# Patient Record
Sex: Female | Born: 1950 | Race: White | Hispanic: No | Marital: Married
Health system: Midwestern US, Community
[De-identification: ages and names within clinical notes are randomized; demographics above are authoritative.]

## PROBLEM LIST (undated history)

## (undated) DIAGNOSIS — I4891 Unspecified atrial fibrillation: Secondary | ICD-10-CM

## (undated) DIAGNOSIS — R011 Cardiac murmur, unspecified: Secondary | ICD-10-CM

## (undated) HISTORY — DX: Unspecified atrial fibrillation: I48.91

## (undated) HISTORY — PX: ABDOMINAL HYSTERECTOMY: SHX81

---

## 2013-03-19 ENCOUNTER — Encounter (HOSPITAL_BASED_OUTPATIENT_CLINIC_OR_DEPARTMENT_OTHER): Payer: Self-pay | Admitting: *Deleted

## 2013-03-19 ENCOUNTER — Emergency Department (HOSPITAL_BASED_OUTPATIENT_CLINIC_OR_DEPARTMENT_OTHER)
Admission: EM | Admit: 2013-03-19 | Discharge: 2013-03-19 | Disposition: A | Discharge status: Home / Self Care | Payer: BC Managed Care – PPO | Attending: Emergency Medicine | Admitting: Emergency Medicine

## 2013-03-19 DIAGNOSIS — Y93G1 Activity, food preparation and clean up: Secondary | ICD-10-CM | POA: Insufficient documentation

## 2013-03-19 DIAGNOSIS — Y92009 Unspecified place in unspecified non-institutional (private) residence as the place of occurrence of the external cause: Secondary | ICD-10-CM | POA: Insufficient documentation

## 2013-03-19 DIAGNOSIS — W268XXA Contact with other sharp object(s), not elsewhere classified, initial encounter: Secondary | ICD-10-CM | POA: Insufficient documentation

## 2013-03-19 DIAGNOSIS — S61209A Unspecified open wound of unspecified finger without damage to nail, initial encounter: Secondary | ICD-10-CM

## 2013-03-19 NOTE — ED Provider Notes (Signed)
History    This chart was scribed for Geoffery Lyons, MD scribed by Magnus Sinning. The patient was seen in room MH12/MH12 at 17:44   CSN: [id number]  Arrival date & time 03/19/13  1629     Chief Complaint  Patient presents with  . Laceration    (Consider location/radiation/quality/duration/timing/severity/associated sxs/prior treatment) Patient is a 62 y.o. female presenting with skin laceration. The history is provided by the patient and the spouse. No language interpreter was used.  Laceration  Monica Lowe is a 62 y.o. female who presents to the Emergency Department complaining of a 1.5 cm laceration to her left finger as a result of an accident that occurred this afternoon in her home. She states that she was unloading dishes from dishwasher and she notes that her left finger was cut on some glass that shattered in the dishwasher. She explains she went to an Urgent Care, but states they were unable to repair because of tendon involvement.  She says that she was given a tetanus at Urgent Care.   History reviewed. No pertinent past medical history.  Past Surgical History  Procedure Laterality Date  . Abdominal hysterectomy      History reviewed. No pertinent family history.  History  Substance Use Topics  . Smoking status: Never Smoker   . Smokeless tobacco: Not on file  . Alcohol Use: No    Review of Systems  Skin:       Left fifth finger laceration   All other systems reviewed and are negative.    Allergies  Sulfa antibiotics  Home Medications  No current outpatient prescriptions on file.  BP 122/60  Pulse 73  Temp(Src) 98.5 F (36.9 C) (Oral)  Resp 16  Ht 5\' 9"  (1.753 m)  Wt 132 lb (59.875 kg)  BMI 19.48 kg/m2  SpO2 100%  Physical Exam  Nursing note and vitals reviewed. Constitutional: She is oriented to person, place, and time. She appears well-developed and well-nourished.  HENT:  Head: Normocephalic and atraumatic.  Eyes: Conjunctivae and  EOM are normal.  Neck: Neck supple.  Cardiovascular: Normal rate.   Pulmonary/Chest: Effort normal.  Musculoskeletal: Normal range of motion. She exhibits no edema.  The left fifth finger is noted to have a laceration on the dorsal aspect between the PIP and DIP joint. The DIP joint is noted to be in flexion and she is unable to extend the fingertip.   Neurological: She is oriented to person, place, and time.  Skin: Skin is warm. No rash noted. No erythema.  Psychiatric: She has a normal mood and affect. Her behavior is normal.    ED Course  Procedures (including critical care time) DIAGNOSTIC STUDIES: Oxygen Saturation is 100% on room air, normal by my interpretation.    COORDINATION OF CARE: 17:45: Physical exam performed. 17:54: Consult performed and patient case discussed with, Orthopedist,Dr. Melvyn Novas. Dr. Melvyn Novas recommends staples to be placed and for the finger to be splinted. Additionally, states patient should follow-up with him on Tuesday, 03/22/13 for evaluation.   Labs Reviewed - No data to display No results found.   No diagnosis found.  LACERATION REPAIR Performed by: Geoffery Lyons Authorized by: Geoffery Lyons Consent: Verbal consent obtained. Risks and benefits: risks, benefits and alternatives were discussed Consent given by: patient Patient identity confirmed: provided demographic data Prepped and Draped in normal sterile fashion Wound explored  Laceration Location: left 5th finger  Laceration Length: 1.5 cm  No Foreign Bodies seen or palpated  Anesthesia: local infiltration  Local anesthetic: lidocaine 1% without epinephrine  Anesthetic total: 1 ml  Irrigation method: syringe Amount of cleaning: standard  Skin closure: 5-0 ethilon  Number of sutures: 2  Technique: simple interrupted  Patient tolerance: Patient tolerated the procedure well with no immediate complications.   MDM  Patient sent here from [facility] clinic for evaluation of a  tendon laceration to the left 5th finger she sustained on a broken glass.  The DIP joint was clearly in flexion and she was unable to extend it.  I spoke with Dr. Melvyn Novas who was on call for hand surgery.  His recommendations were for follow up in the office on Tuesday when he would repair the injury in the office.  He recommended loose closure of the wound which I did perform.  She received a dtap at the outside clinic.  She will be discharged to home, with follow up with Dr. Melvyn Novas.    I personally performed the services described in this documentation, which was scribed in my presence. The recorded information has been reviewed and is accurate.           Geoffery Lyons, MD 03/20/13 [phone]

## 2013-03-19 NOTE — ED Notes (Signed)
Pt states she cut her left little finger on a piece of glass. Sent here from [facility] d/t possible tendon involvement. Bandage PTA and PMS intact.

## 2014-09-05 ENCOUNTER — Other Ambulatory Visit (HOSPITAL_COMMUNITY): Payer: BC Managed Care – PPO

## 2014-09-11 ENCOUNTER — Ambulatory Visit (HOSPITAL_COMMUNITY): Payer: BC Managed Care – PPO | Attending: Internal Medicine | Admitting: Cardiology

## 2014-09-11 ENCOUNTER — Other Ambulatory Visit (HOSPITAL_COMMUNITY): Payer: BC Managed Care – PPO | Admitting: Family Medicine

## 2014-09-11 DIAGNOSIS — I379 Nonrheumatic pulmonary valve disorder, unspecified: Secondary | ICD-10-CM | POA: Diagnosis not present

## 2014-09-11 DIAGNOSIS — R011 Cardiac murmur, unspecified: Secondary | ICD-10-CM

## 2014-09-11 DIAGNOSIS — I079 Rheumatic tricuspid valve disease, unspecified: Secondary | ICD-10-CM | POA: Diagnosis not present

## 2014-09-11 NOTE — Progress Notes (Signed)
Echo performed. 

## 2016-04-19 ENCOUNTER — Inpatient Hospital Stay: Admit: 2016-04-19 | Discharge: 2016-04-20 | Disposition: A | Discharge status: Home / Self Care

## 2016-04-19 NOTE — ED Provider Notes (Signed)
PATIENT:          Lindsey JunglingRITTENHOUSE, Lindsey      DOS:           04/19/2016  MR #:             [id number]             ACCOUNT #:     [id number]  DATE OF BIRTH:    [birth year]              AGE:           65      HISTORY OF PRESENT ILLNESS:    PERTINENT HISTORY OF PRESENT  ILLNESS. 65 year old female,  presenting with  shortness of breath and generalized weakness.  Patient notes that  since  December she had what sounded like a upper respiratory infection that  then  progressed to bronchitis and has never really recovered since then.  However  [place]day [place] to [place] her family here on his 7  hour car  ride.  Was feeling okay yesterday but then towards the evening last  night  started to feel short of breath and started having some chest  tightness.  She  states that this worsened anytime she tried to exert herself and  basically  today has not really been able to get up out of bed because she just  feels so  weak.  She does report 7 episodes of diarrhea today, nonbloody.  Denies any  abdominal pain.  Denies any nausea or vomiting.  Has also had some  decreased  appetite but denies any fevers.  No cough.  No leg swelling or leg  pain.  No  history of deep vein thromboses.    PERTINENT PAST/ FAMILY/SOCIAL HISTORY Past medical history: none  Past surgical history: Hysterectomy  Family history: reviewed and noncontributory  Social history: denies tobacco, etoh, and illicit drug use    Review of systems: as per hpi otherwise negative in detail        PHYSICAL EXAM Vital Signs: Reviewed  Constitutional: comfortable, no acute distress  Head: normocephalic, atraumatic  Eyes: no scleral injection, no scleral icterus, no conjunctival  erythema  ENT: oropharynx clear, dry MM  Neck: supple, trachea midline  Cardiovascular: Tachycardic, RR, no m/r/g  Pulmonary: no evidence of labored breathing, clear to auscultation  bilaterally  Abdominal: soft, nontender, nondistended  Extremities: warm, well perfused, no edema,  no calf tenderness  Neurological: AAO x 3, nonfocal  Skin: warm, dry, no rash  Psych: no suicidal ideation    MEDICAL DECISION MAKING:    SIGNIFICANT FINDINGS/ED COURSE/MEDICAL DECISION MAKING/TREATMENT  PLAN EKG  (04/19/16, 19:37): Rate: 98, Rhythm: sinus, Axis: normal, Intervals: PR  165, QRS  74, QTc 421, Interpretation: no acute ischemic changes, Old: no old to  compare    Patient's blood work was unremarkable including a negative troponin.  lectrocardiogram also without any acute changes.  CT of the chest  showed no  signs of pulmonary embolism.  Patient this point is no signs of any  clot.  Low  concern for ACS and patient with a heart score of 2.  I did offer the  patient a  repeat troponin 3 hours did discuss decreasing her risk from 2% 1%.  Patient  does not want to wait for that.  I do believe that she likely has  dehydration  secondary to diarrhea but with a benign abdominal exam have low  concern for any  acute processes requiring antibiotics.  A counseled her on continued  hydration.   She is from [place] was given copies of her lab work as well  as her  imaging and counseled to get a primary care doctor when she gets back  home.  She was given strict return instructions and stated understanding.    Please note this report has been produced using speech recognition  software and  may contain errors related to that system including errors in  grammar,  punctuation, and spelling, as well as words and phrases that may be  inappropriate. If there are questions or concerns please feel free to  contact  the dictating provider for clarification.    PROBLEM LIST:       Admit Reason:     Short of Breath, Weakness: Entered Date: 19-Apr-2016 19:43, Entered  By:  Standley Brooking, Status: Active        DIAGNOSIS 1. Dehydration, acute, initial visit  2. dyspnea, acute, initial visit  3. diarrhea, acute, initial visit    Electronic Signatures:  Ronaldo Miyamoto (MD)  (Signed 19-Apr-2016 23:50)   Authored:  HISTORY OF PRESENT ILLNESS, PHYSICAL EXAM, MEDICAL DECISION  MAKING,  PROBLEM LIST, DIAGNOSIS      Last Updated: 19-Apr-2016 23:50 by Ronaldo Miyamoto (MD)            Please see T-Sheet, initial assessment, and physician orders for  further details.    Dictating Physician: Ronaldo Miyamoto, MD  Original Electronic Signature Date: 04/19/2016 08:04 P  BL  Document #: [id number]    cc:  PCP No       So[place]

## 2016-04-20 LAB — CBC WITH AUTO DIFFERENTIAL
Absolute Baso #: 0.1 10*3/uL (ref 0.0–0.2)
Absolute Eos #: 0.3 10*3/uL (ref 0.0–0.5)
Absolute Lymph #: 1.2 10*3/uL (ref 1.0–4.3)
Absolute Mono #: 0.4 10*3/uL (ref 0.0–0.8)
Absolute Neut #: 8.3 10*3/uL — ABNORMAL HIGH (ref 1.8–7.0)
Basophils: 0.5 %
Eosinophils: 3.1 %
Granulocytes %: 80.7 %
Hematocrit: 40.8 % (ref 35.0–47.0)
Hemoglobin: 13.1 g/dL (ref 11.7–16.0)
Lymphocyte %: 11.5 %
MCH: 27.5 pg (ref 26.0–34.0)
MCHC: 32.2 % (ref 32.0–36.0)
MCV: 85.3 fL (ref 79.0–98.0)
MPV: 9.2 fL (ref 7.4–10.4)
Monocytes: 4.2 %
Platelets: 253 10*3/uL (ref 140–440)
RBC: 4.78 10*6/uL (ref 3.80–5.20)
RDW: 13.6 % (ref 11.5–14.5)
WBC: 10.2 10*3/uL (ref 3.6–10.7)

## 2016-04-20 LAB — COMPREHENSIVE METABOLIC PANEL
ALT: 20 U/L (ref 12–78)
AST: 29 U/L (ref 15–37)
Albumin,Serum: 4 g/dL (ref 3.4–5.0)
Alkaline Phosphatase: 61 U/L (ref 45–117)
Anion Gap: 9 NA
BUN: 10 mg/dL (ref 7–25)
CO2: 26 mmol/L (ref 21–32)
Calcium: 9 mg/dL (ref 8.2–10.1)
Chloride: 105 mmol/L (ref 98–109)
Creatinine: 0.67 mg/dL (ref 0.55–1.40)
EGFR IF NonAfrican American: >60 mL/min (ref >60)
Glucose: 88 mg/dL (ref 70–100)
Potassium: 3.8 mmol/L (ref 3.5–5.1)
Sodium: 140 mmol/L (ref 135–145)
Total Bilirubin: 0.4 mg/dL (ref 0.2–1.0)
Total Protein: 8 g/dL (ref 6.4–8.2)
eGFR African American: >60 mL/min (ref >60)

## 2016-04-20 LAB — MAGNESIUM: Magnesium: 2.5 mg/dL — ABNORMAL HIGH (ref 1.8–2.4)

## 2016-04-20 LAB — TROPONIN: Troponin I: 0.037 ng/mL (ref 0.000–0.045)

## 2016-04-20 LAB — BRAIN NATRIURETIC PEPTIDE: NT Pro-BNP: 98 pg/mL (ref 0–125)

## 2016-04-20 NOTE — Other (Unsigned)
Patient Acct Nbr: [id number]   Primary AUTH/CERT:   Primary Insurance Company Name: [employer] [facility]  Primary Insurance Plan name: [employer]  Primary Insurance Group Number: [id number]  Primary Insurance Plan Type: Health  Primary Insurance Policy Number: [id number]

## 2017-03-19 DIAGNOSIS — Z1272 Encounter for screening for malignant neoplasm of vagina: Secondary | ICD-10-CM | POA: Diagnosis not present

## 2018-02-05 DIAGNOSIS — R0602 Shortness of breath: Secondary | ICD-10-CM | POA: Diagnosis not present

## 2018-02-05 DIAGNOSIS — J9809 Other diseases of bronchus, not elsewhere classified: Secondary | ICD-10-CM | POA: Diagnosis not present

## 2018-02-05 DIAGNOSIS — J069 Acute upper respiratory infection, unspecified: Secondary | ICD-10-CM | POA: Diagnosis not present

## 2018-02-09 DIAGNOSIS — Z792 Long term (current) use of antibiotics: Secondary | ICD-10-CM | POA: Diagnosis not present

## 2018-02-09 DIAGNOSIS — J45909 Unspecified asthma, uncomplicated: Secondary | ICD-10-CM | POA: Diagnosis not present

## 2019-06-02 DIAGNOSIS — Z1211 Encounter for screening for malignant neoplasm of colon: Secondary | ICD-10-CM | POA: Diagnosis not present

## 2020-08-27 DIAGNOSIS — Z20828 Contact with and (suspected) exposure to other viral communicable diseases: Secondary | ICD-10-CM | POA: Diagnosis not present

## 2020-09-13 DIAGNOSIS — I83811 Varicose veins of right lower extremities with pain: Secondary | ICD-10-CM | POA: Diagnosis not present

## 2020-09-13 DIAGNOSIS — I8311 Varicose veins of right lower extremity with inflammation: Secondary | ICD-10-CM | POA: Diagnosis not present

## 2020-09-13 DIAGNOSIS — I83891 Varicose veins of right lower extremities with other complications: Secondary | ICD-10-CM | POA: Diagnosis not present

## 2020-09-19 DIAGNOSIS — I8311 Varicose veins of right lower extremity with inflammation: Secondary | ICD-10-CM | POA: Diagnosis not present

## 2020-10-02 DIAGNOSIS — I83811 Varicose veins of right lower extremities with pain: Secondary | ICD-10-CM | POA: Diagnosis not present

## 2020-10-02 DIAGNOSIS — I8311 Varicose veins of right lower extremity with inflammation: Secondary | ICD-10-CM | POA: Diagnosis not present

## 2020-10-19 DIAGNOSIS — Z23 Encounter for immunization: Secondary | ICD-10-CM | POA: Diagnosis not present

## 2020-10-29 DIAGNOSIS — L820 Inflamed seborrheic keratosis: Secondary | ICD-10-CM | POA: Diagnosis not present

## 2020-10-31 DIAGNOSIS — I8311 Varicose veins of right lower extremity with inflammation: Secondary | ICD-10-CM | POA: Diagnosis not present

## 2020-11-08 DIAGNOSIS — I8311 Varicose veins of right lower extremity with inflammation: Secondary | ICD-10-CM | POA: Diagnosis not present

## 2020-11-16 DIAGNOSIS — I8311 Varicose veins of right lower extremity with inflammation: Secondary | ICD-10-CM | POA: Diagnosis not present

## 2020-12-12 DIAGNOSIS — I8311 Varicose veins of right lower extremity with inflammation: Secondary | ICD-10-CM | POA: Diagnosis not present

## 2020-12-12 DIAGNOSIS — I83811 Varicose veins of right lower extremities with pain: Secondary | ICD-10-CM | POA: Diagnosis not present

## 2020-12-12 DIAGNOSIS — M7981 Nontraumatic hematoma of soft tissue: Secondary | ICD-10-CM | POA: Diagnosis not present

## 2021-02-11 ENCOUNTER — Other Ambulatory Visit: Payer: Self-pay

## 2021-02-11 ENCOUNTER — Ambulatory Visit (INDEPENDENT_AMBULATORY_CARE_PROVIDER_SITE_OTHER): Payer: Medicare Other | Admitting: Dermatology

## 2021-02-11 ENCOUNTER — Encounter: Payer: Self-pay | Admitting: Dermatology

## 2021-02-11 DIAGNOSIS — D1800 Hemangioma unspecified site: Secondary | ICD-10-CM

## 2021-02-11 DIAGNOSIS — Z1283 Encounter for screening for malignant neoplasm of skin: Secondary | ICD-10-CM

## 2021-02-11 DIAGNOSIS — L738 Other specified follicular disorders: Secondary | ICD-10-CM | POA: Diagnosis not present

## 2021-02-11 DIAGNOSIS — L820 Inflamed seborrheic keratosis: Secondary | ICD-10-CM | POA: Diagnosis not present

## 2021-02-11 NOTE — Patient Instructions (Signed)
  Dr. Bud Face, MD, [facility]  Plastic surgeon in [place], [place] Address: [address] [place], [place] [address] Hours:  Open ? Closes [place]: Mask required  Temperature check required  Staff wear masks  More details Phone: [phone]

## 2021-02-20 DIAGNOSIS — I8311 Varicose veins of right lower extremity with inflammation: Secondary | ICD-10-CM | POA: Diagnosis not present

## 2021-02-22 ENCOUNTER — Encounter: Payer: Self-pay | Admitting: Dermatology

## 2021-02-22 NOTE — Progress Notes (Signed)
   New Patient   Subjective  Monica Lowe is a 70 y.o. female who presents for the following: Annual Exam (Upper body check).  General skin check Location: Several places she would like looked at. Duration:  Quality:  Associated Signs/Symptoms: Modifying Factors:  Severity:  Timing: Context:    The following portions of the chart were reviewed this encounter and updated as appropriate:  Tobacco  Allergies  Meds  Problems  Med Hx  Surg Hx  Fam Hx      Objective  Well appearing patient in no apparent distress; mood and affect are within normal limits. Objective  Left Breast: Had a previous dermatologist; no biopsies she said to show skin cancer. No real concerns for today.  Waist up skin examination today showed no atypical moles, melanoma, or nonmelanoma skin cancer.  Objective  Right Breast: Waist up skin examination-no atypical moles or non mole skin cancers  Objective  Left Breast, Left Upper Back (14), Neck - Anterior: Inflamed tan-pink flat keratotic papules  Objective  Mid Back: Multiple red raised 1 to 3 mm smooth papules  Objective  Right Buccal Cheek: 2 mm cream-colored papule with a central dell.     All skin waist up examined.   Assessment & Plan  Screening exam for skin cancer Left Breast  Yearly skin examination, encouraged to self examine twice annually.  Encounter for screening for malignant neoplasm of skin Right Breast  Yearly skin check  Seborrheic keratosis, inflamed (16) Neck - Anterior; Left Breast; Left Upper Back (14)  Destruction of lesion - Left Breast, Left Upper Back, Neck - Anterior Complexity: simple   Destruction method: cryotherapy   Informed consent: discussed and consent obtained   Timeout:  patient name, date of birth, surgical site, and procedure verified Lesion destroyed using liquid nitrogen: Yes   Cryotherapy cycles:  3 Outcome: patient tolerated procedure well with no complications    Hemangioma,  unspecified site Mid Back  Okay to leave if stable  Sebaceous hyperplasia of face Right Buccal Cheek  Patient told that this may resemble early basal cell skin cancer so if there is growth or bleeding she'll return for biopsy.

## 2021-03-01 DIAGNOSIS — I8311 Varicose veins of right lower extremity with inflammation: Secondary | ICD-10-CM | POA: Diagnosis not present

## 2021-03-01 NOTE — Progress Notes (Signed)
I, Lavonna Monarch, MD, have reviewed all documentation for this visit. The documentation on 03/01/21 for the exam, diagnosis, procedures, and orders are all accurate and complete.

## 2021-03-06 DIAGNOSIS — I8311 Varicose veins of right lower extremity with inflammation: Secondary | ICD-10-CM | POA: Diagnosis not present

## 2021-03-15 DIAGNOSIS — I8311 Varicose veins of right lower extremity with inflammation: Secondary | ICD-10-CM | POA: Diagnosis not present

## 2021-04-05 DIAGNOSIS — I8311 Varicose veins of right lower extremity with inflammation: Secondary | ICD-10-CM | POA: Diagnosis not present

## 2021-06-04 DIAGNOSIS — H524 Presbyopia: Secondary | ICD-10-CM | POA: Diagnosis not present

## 2021-06-04 DIAGNOSIS — H52223 Regular astigmatism, bilateral: Secondary | ICD-10-CM | POA: Diagnosis not present

## 2021-06-04 DIAGNOSIS — H43813 Vitreous degeneration, bilateral: Secondary | ICD-10-CM | POA: Diagnosis not present

## 2021-06-04 DIAGNOSIS — D3132 Benign neoplasm of left choroid: Secondary | ICD-10-CM | POA: Diagnosis not present

## 2021-06-04 DIAGNOSIS — H40023 Open angle with borderline findings, high risk, bilateral: Secondary | ICD-10-CM | POA: Diagnosis not present

## 2021-06-04 DIAGNOSIS — H5203 Hypermetropia, bilateral: Secondary | ICD-10-CM | POA: Diagnosis not present

## 2021-07-10 DIAGNOSIS — H40022 Open angle with borderline findings, high risk, left eye: Secondary | ICD-10-CM | POA: Diagnosis not present

## 2021-07-10 DIAGNOSIS — H40011 Open angle with borderline findings, low risk, right eye: Secondary | ICD-10-CM | POA: Diagnosis not present

## 2021-10-21 DIAGNOSIS — Z23 Encounter for immunization: Secondary | ICD-10-CM | POA: Diagnosis not present

## 2022-01-24 DIAGNOSIS — Z780 Asymptomatic menopausal state: Secondary | ICD-10-CM | POA: Diagnosis not present

## 2022-01-24 DIAGNOSIS — Z1231 Encounter for screening mammogram for malignant neoplasm of breast: Secondary | ICD-10-CM | POA: Diagnosis not present

## 2022-01-24 DIAGNOSIS — R030 Elevated blood-pressure reading, without diagnosis of hypertension: Secondary | ICD-10-CM | POA: Diagnosis not present

## 2022-01-24 DIAGNOSIS — Z0000 Encounter for general adult medical examination without abnormal findings: Secondary | ICD-10-CM | POA: Diagnosis not present

## 2022-01-24 DIAGNOSIS — Z1211 Encounter for screening for malignant neoplasm of colon: Secondary | ICD-10-CM | POA: Diagnosis not present

## 2022-01-24 DIAGNOSIS — Z136 Encounter for screening for cardiovascular disorders: Secondary | ICD-10-CM | POA: Diagnosis not present

## 2022-01-24 DIAGNOSIS — M810 Age-related osteoporosis without current pathological fracture: Secondary | ICD-10-CM | POA: Diagnosis not present

## 2022-03-20 ENCOUNTER — Emergency Department (HOSPITAL_BASED_OUTPATIENT_CLINIC_OR_DEPARTMENT_OTHER)
Admission: EM | Admit: 2022-03-20 | Discharge: 2022-03-20 | Disposition: A | Discharge status: Home / Self Care | Payer: Medicare Other | Attending: Emergency Medicine | Admitting: Emergency Medicine

## 2022-03-20 ENCOUNTER — Emergency Department (HOSPITAL_BASED_OUTPATIENT_CLINIC_OR_DEPARTMENT_OTHER): Payer: Medicare Other

## 2022-03-20 ENCOUNTER — Encounter (HOSPITAL_BASED_OUTPATIENT_CLINIC_OR_DEPARTMENT_OTHER): Payer: Self-pay | Admitting: Obstetrics and Gynecology

## 2022-03-20 ENCOUNTER — Other Ambulatory Visit: Payer: Self-pay

## 2022-03-20 ENCOUNTER — Ambulatory Visit (HOSPITAL_COMMUNITY)
Admission: EM | Admit: 2022-03-20 | Discharge: 2022-03-20 | Disposition: A | Discharge status: Home / Self Care | Payer: Medicare Other

## 2022-03-20 ENCOUNTER — Encounter (HOSPITAL_COMMUNITY): Payer: Self-pay | Admitting: *Deleted

## 2022-03-20 DIAGNOSIS — S80212A Abrasion, left knee, initial encounter: Secondary | ICD-10-CM | POA: Diagnosis not present

## 2022-03-20 DIAGNOSIS — S8002XA Contusion of left knee, initial encounter: Secondary | ICD-10-CM | POA: Diagnosis not present

## 2022-03-20 DIAGNOSIS — W01198A Fall on same level from slipping, tripping and stumbling with subsequent striking against other object, initial encounter: Secondary | ICD-10-CM | POA: Insufficient documentation

## 2022-03-20 DIAGNOSIS — S0083XA Contusion of other part of head, initial encounter: Secondary | ICD-10-CM | POA: Diagnosis not present

## 2022-03-20 DIAGNOSIS — S8992XA Unspecified injury of left lower leg, initial encounter: Secondary | ICD-10-CM

## 2022-03-20 DIAGNOSIS — S0990XA Unspecified injury of head, initial encounter: Secondary | ICD-10-CM

## 2022-03-20 DIAGNOSIS — S060X0A Concussion without loss of consciousness, initial encounter: Secondary | ICD-10-CM | POA: Insufficient documentation

## 2022-03-20 NOTE — ED Triage Notes (Signed)
Patient slipped off the curb in [place] and fell hitting a parking meter with her head.  ?

## 2022-03-20 NOTE — ED Triage Notes (Signed)
DR Windy Carina in triage room for assessment. ?

## 2022-03-20 NOTE — ED Provider Notes (Signed)
?[facility] ?Provider Note ? ? ?CSN: [id number] ?Arrival date & time: 03/20/22  1636 ? ?  ? ?History ? ?Chief Complaint  ?Patient presents with  ? Fall  ? ? ?Monica Lowe is a 71 y.o. female presented to emergency department with a head injury.  The patient reports that she slipped stepping off a curb today and fell forward and struck her left forehead on a street pole.  There is no loss of consciousness.  She says subsequently she began to feel somewhat dazed and nauseated.  Her PCP advised to come to the ER for CT scan.  She is not on any medications including blood thinners.  She is here with her son at bedside. ? ?HPI ? ?  ? ?Home Medications ?Prior to Admission medications   ?Not on File  ?   ? ?Allergies    ?Sulfa antibiotics   ? ?Review of Systems   ?Review of Systems ? ?Physical Exam ?Updated Vital Signs ?BP 140/86 (BP Location: Right Arm)   Pulse 80   Temp 98 ?F (36.7 ?C)   Resp 16   Ht '5\' 8"'$  (1.727 m)   Wt 63.5 kg   SpO2 99%   BMI 21.29 kg/m?  ?Physical Exam ?Constitutional:   ?   General: She is not in acute distress. ?HENT:  ?   Head: Normocephalic.  ?   Comments: Left forehead hematoma ?Eyes:  ?   Conjunctiva/sclera: Conjunctivae normal.  ?   Pupils: Pupils are equal, round, and reactive to light.  ?Cardiovascular:  ?   Rate and Rhythm: Normal rate and regular rhythm.  ?Pulmonary:  ?   Effort: Pulmonary effort is normal. No respiratory distress.  ?Musculoskeletal:  ?   Comments: Abrasion to left patella ?Full range of motion of the left knee, no isolated tenderness of patellar head or fibular head, patient able to bear weight fully at the time of the accident and in the ER  ?Skin: ?   General: Skin is warm and dry.  ?Neurological:  ?   General: No focal deficit present.  ?   Mental Status: She is alert and oriented to person, place, and time. Mental status is at baseline.  ?   Sensory: No sensory deficit.  ?   Motor: No weakness.  ?Psychiatric:     ?   Mood and  Affect: Mood normal.     ?   Behavior: Behavior normal.  ? ? ?ED Results / Procedures / Treatments   ?Labs ?(all labs ordered are listed, but only abnormal results are displayed) ?Labs Reviewed - No data to display ? ?EKG ?None ? ?Radiology ?CT Head Wo Contrast ? ?Result Date: 03/20/2022 ?CLINICAL DATA:  Head trauma. Moderate to severe left forehead hematoma. Fall and struck head on street pole. Slipped off curb and [place] and fell hitting a parking meter with her head. EXAM: CT HEAD WITHOUT CONTRAST TECHNIQUE: Contiguous axial images were obtained from the base of the skull through the vertex without intravenous contrast. RADIATION DOSE REDUCTION: This exam was performed according to the departmental dose-optimization program which includes automated exposure control, adjustment of the mA and/or kV according to patient size and/or use of iterative reconstruction technique. COMPARISON:  None. FINDINGS: Brain: The ventricles are normal in size and configuration. The basilar cisterns are patent. No mass, mass effect, or midline shift. No acute intracranial hemorrhage is seen. No abnormal extra-axial fluid collection. Preservation of the normal cortical gray-white interface without CT evidence of an acute  major vascular territorial cortical based infarction. Vascular: No hyperdense vessel or unexpected calcification. Skull: Normal. Negative for fracture or focal lesion. Sinuses/Orbits: The visualized orbits are unremarkable. The visualized paranasal sinuses and mastoid air cells are clear. Other: There is a moderate hematoma within the left forehead soft tissues measuring up to 13 mm in AP thickness, 19 mm in craniocaudal dimension, and 20 mm in transverse dimension. Moderate surrounding soft tissue swelling. IMPRESSION:: IMPRESSION: 1. Moderate left forehead soft tissue swelling and hematoma. 2. No acute intracranial process. Electronically Signed   By: Yvonne Kendall M.D.   On: 03/20/2022 17:48  ? ?CT  Cervical Spine Wo Contrast ? ?Result Date: 03/20/2022 ?CLINICAL DATA:  Neck trauma.  Fell, hitting parking meter with head. EXAM: CT CERVICAL SPINE WITHOUT CONTRAST TECHNIQUE: Multidetector CT imaging of the cervical spine was performed without intravenous contrast. Multiplanar CT image reconstructions were also generated. RADIATION DOSE REDUCTION: This exam was performed according to the departmental dose-optimization program which includes automated exposure control, adjustment of the mA and/or kV according to patient size and/or use of iterative reconstruction technique. COMPARISON:  None. FINDINGS: Alignment: There is straightening of the normal cervical lordosis. No sagittal spondylolisthesis. The facet joints are normally aligned. Skull base and vertebrae: The atlantodens interval is intact. Vertebral body heights are maintained. Mild posterior C3-4, moderate posterior C5-6, moderate to severe diffuse C6-7, and mild anterior greater than posterior T1-2 disc space narrowing. No acute fracture is seen. Soft tissues and spinal canal: No prevertebral fluid or swelling. No visible canal hematoma. Disc levels: Multilevel degenerative changes including disc space narrowing, uncovertebral hypertrophy and facet joint hypertrophy contribute to mild left-greater-than-right C3-4 and moderate to severe left C5-6 neuroforaminal stenosis. No significant central canal stenosis. Upper chest: The lung apices are unremarkable. Other: No cervical chain lymphadenopathy. IMPRESSION:: IMPRESSION: 1. No acute fracture is seen. 2. Straightening of the normal cervical lordosis. 3. Degenerative disc and endplate changes greatest at C6-7. 4. Mild left-greater-than-right C3-4 and moderate to severe left C5-6 neuroforaminal stenosis. Electronically Signed   By: Yvonne Kendall M.D.   On: 03/20/2022 17:54   ? ?Procedures ?Procedures  ? ? ?Medications Ordered in ED ?Medications - No data to display ? ?ED Course/ Medical Decision Making/ A&P ?   ?                        ?Medical Decision Making ?Amount and/or Complexity of Data Reviewed ?Radiology: ordered. ? ? ?Patient is a mechanical fall and injury to the left forehead.  She has some neck pain, no significant spinal midline tenderness, but cannot rule out with C-spine criteria as she has distracting injury.  Given her age without it is reasonable obtain CT scan of both the head and the cervical spine and she is agreeable.  I personally reviewed and interpreted these images, which showed no acute traumatic injuries. ? ?I discussed with her the likelihood that she has a concussion based on her symptoms.  I would expect her to make a recovery in 2 to 4 weeks.  Concussion precautions discussed with her and her son. ? ?She does have a small abrasion to the left knee but is [place] criteria negative, low suspicion for knee fracture did not feel that x-ray of the knee was warranted at this ? ? ? ? ? ? ? ?Final Clinical Impression(s) / ED Diagnoses ?Final diagnoses:  ?Injury of head, initial encounter  ?Traumatic hematoma of forehead, initial encounter  ?Injury of left knee, initial  encounter  ?Concussion without loss of consciousness, initial encounter  ? ? ?Rx / DC Orders ?ED Discharge Orders   ? ? None  ? ?  ? ? ?  ?Wyvonnia Dusky, MD ?03/21/22 [phone] ? ?

## 2022-03-20 NOTE — ED Triage Notes (Signed)
This morning Pt reports she was stepping up on curb and her foot slipped . Pt hit her fore head on a parking meter. Pt denies any LOC. Pt is reporting neck pain and back pain. Pt has a large hematoma on Lt side of forehead. Pt denies any vision changes. ?

## 2022-03-20 NOTE — ED Provider Notes (Signed)
Patient seen in triage.  About 6 hours ago she slipped and hit her left forehead on a parking meter.  She did not have loss of consciousness.  She proceeded on to clean an air B&B right after that, and now she feels worse and is tired.  She has a goose egg on her left frontal area.  And has some neck pain. ? ?She is not on blood thinner ? ?With her her feeling more fatigued and feeling worse, we have asked her to proceed to one of the ERs for possible CT ?  ?Barrett Henle, MD ?03/20/22 [phone] ? ?

## 2022-03-31 ENCOUNTER — Ambulatory Visit: Payer: Medicare Other | Admitting: Dermatology

## 2022-04-23 DIAGNOSIS — Z1211 Encounter for screening for malignant neoplasm of colon: Secondary | ICD-10-CM | POA: Diagnosis not present

## 2022-04-23 DIAGNOSIS — K648 Other hemorrhoids: Secondary | ICD-10-CM | POA: Diagnosis not present

## 2022-06-16 ENCOUNTER — Ambulatory Visit (INDEPENDENT_AMBULATORY_CARE_PROVIDER_SITE_OTHER): Payer: Medicare Other | Admitting: Dermatology

## 2022-06-16 ENCOUNTER — Encounter: Payer: Self-pay | Admitting: Dermatology

## 2022-06-16 DIAGNOSIS — L821 Other seborrheic keratosis: Secondary | ICD-10-CM

## 2022-06-16 DIAGNOSIS — L738 Other specified follicular disorders: Secondary | ICD-10-CM

## 2022-06-16 DIAGNOSIS — Z1283 Encounter for screening for malignant neoplasm of skin: Secondary | ICD-10-CM | POA: Diagnosis not present

## 2022-06-16 DIAGNOSIS — D1800 Hemangioma unspecified site: Secondary | ICD-10-CM

## 2022-07-12 ENCOUNTER — Encounter: Payer: Self-pay | Admitting: Dermatology

## 2022-07-12 NOTE — Progress Notes (Signed)
   Follow-Up Visit   Subjective  Monica Lowe is a 71 y.o. female who presents for the following: Annual Exam (No new concerns).  General skin examination Location:  Duration:  Quality:  Associated Signs/Symptoms: Modifying Factors:  Severity:  Timing: Context:   Objective  Well appearing patient in no apparent distress; mood and affect are within normal limits. Full body skin examination: No atypical pigmented lesions (all checked with dermoscopy), no nonmelanoma skin cancer  Multiple 1 mm smooth red dermal papules  left shoulder 4 mm textured brown flattopped papule, compatible dermoscopy  Head - Anterior (Face) Several 2 mm subtle flesh-colored papules some with eccentric dell    A full examination was performed including scalp, head, eyes, ears, nose, lips, neck, chest, axillae, abdomen, back, buttocks, bilateral upper extremities, bilateral lower extremities, hands, feet, fingers, toes, fingernails, and toenails. All findings within normal limits unless otherwise noted below.  Areas beneath undergarments not fully examined   Assessment & Plan    Encounter for screening for malignant neoplasm of skin  Seborrheic keratosis left shoulder  Leave if stable  Sebaceous hyperplasia of face Head - Anterior (Face)  Told of similar appearance of early BCC so if there is growth or bleeding return for biopsy  Hemangioma, unspecified site  No intervention necessary      I, Lavonna Monarch, MD, have reviewed all documentation for this visit.  The documentation on 07/12/22 for the exam, diagnosis, procedures, and orders are all accurate and complete.

## 2022-09-18 DIAGNOSIS — Z23 Encounter for immunization: Secondary | ICD-10-CM | POA: Diagnosis not present

## 2022-12-18 DIAGNOSIS — S40021A Contusion of right upper arm, initial encounter: Secondary | ICD-10-CM | POA: Diagnosis not present

## 2022-12-18 DIAGNOSIS — S3992XA Unspecified injury of lower back, initial encounter: Secondary | ICD-10-CM | POA: Diagnosis not present

## 2022-12-18 DIAGNOSIS — S39012A Strain of muscle, fascia and tendon of lower back, initial encounter: Secondary | ICD-10-CM | POA: Diagnosis not present

## 2022-12-18 DIAGNOSIS — M542 Cervicalgia: Secondary | ICD-10-CM | POA: Diagnosis not present

## 2022-12-18 DIAGNOSIS — Z743 Need for continuous supervision: Secondary | ICD-10-CM | POA: Diagnosis not present

## 2022-12-18 DIAGNOSIS — R079 Chest pain, unspecified: Secondary | ICD-10-CM | POA: Diagnosis not present

## 2022-12-18 DIAGNOSIS — S5011XA Contusion of right forearm, initial encounter: Secondary | ICD-10-CM | POA: Diagnosis not present

## 2023-01-13 DIAGNOSIS — Z1231 Encounter for screening mammogram for malignant neoplasm of breast: Secondary | ICD-10-CM | POA: Diagnosis not present

## 2023-01-13 DIAGNOSIS — Z0000 Encounter for general adult medical examination without abnormal findings: Secondary | ICD-10-CM | POA: Diagnosis not present

## 2023-01-13 DIAGNOSIS — M545 Low back pain, unspecified: Secondary | ICD-10-CM | POA: Diagnosis not present

## 2023-05-18 IMAGING — CT CT HEAD W/O CM
4 series · 16 of 47 positions shown, 18 images · non-contrast
Comparison: None.

CLINICAL DATA: Head trauma. Moderate to severe left forehead
hematoma. Fall and struck head on street pole. Slipped off curb and
[place] [HOSPITAL] and fell hitting a parking meter with her head.



[Series 2: head wo · axial · 0.46mm/px · z∈[-388,-268]mm · 7 of 33 slices shown, 9 images]
[im 5/33  brain]
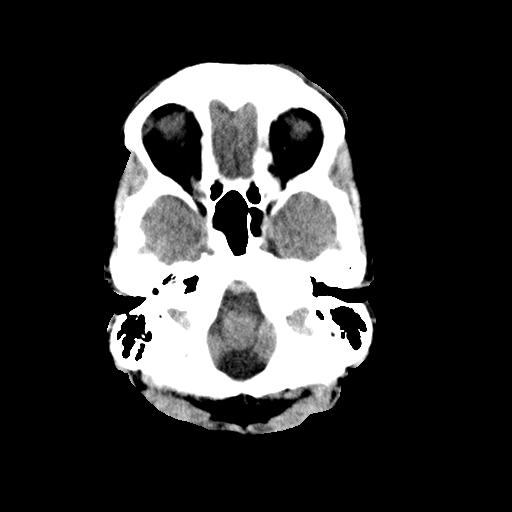
[im 5/33  bone]
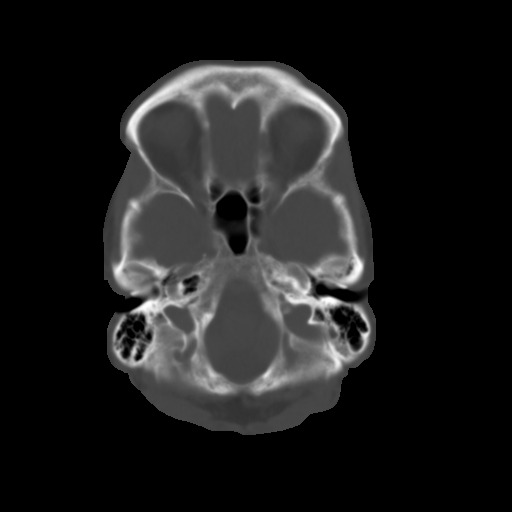
[im 9/33  brain]
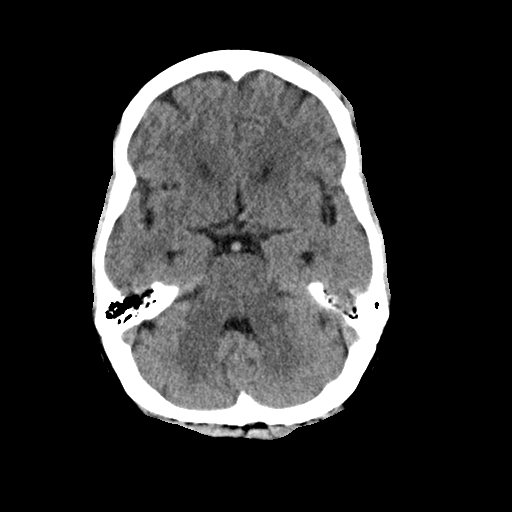
[im 13/33  brain]
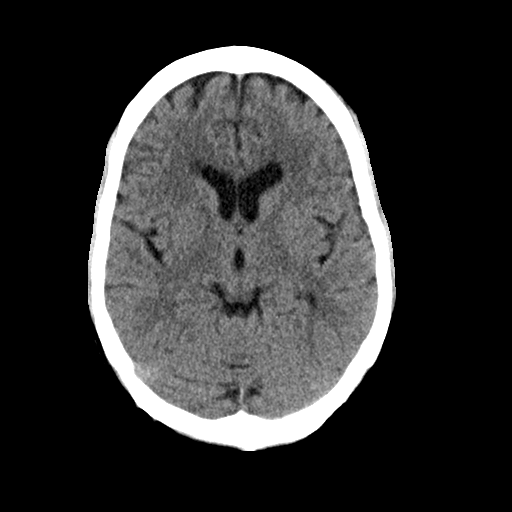
[im 17/33  brain]
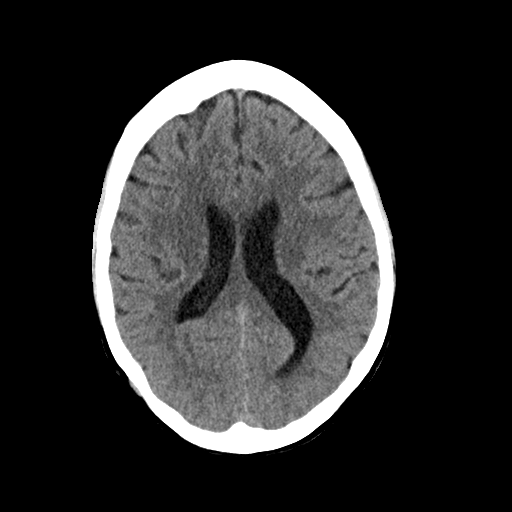
[im 21/33  brain]
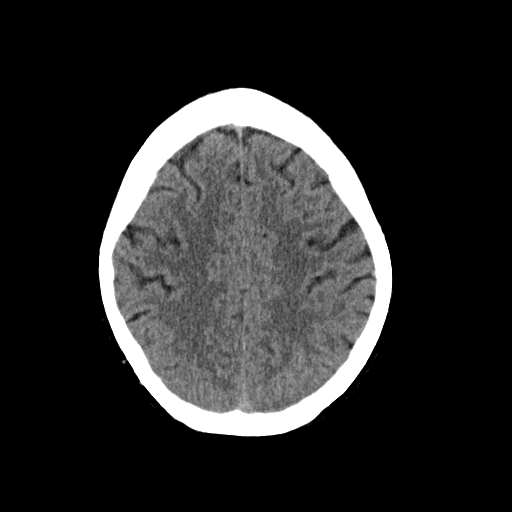
[im 21/33  bone]
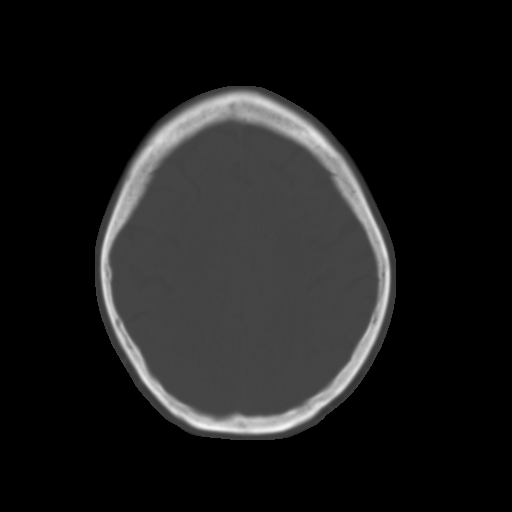
[im 25/33  brain]
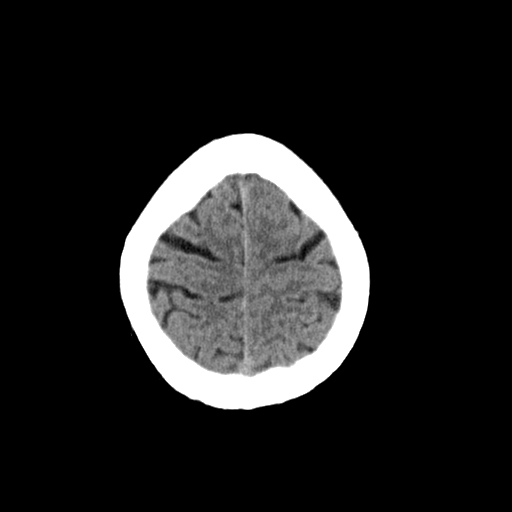
[im 29/33  brain]
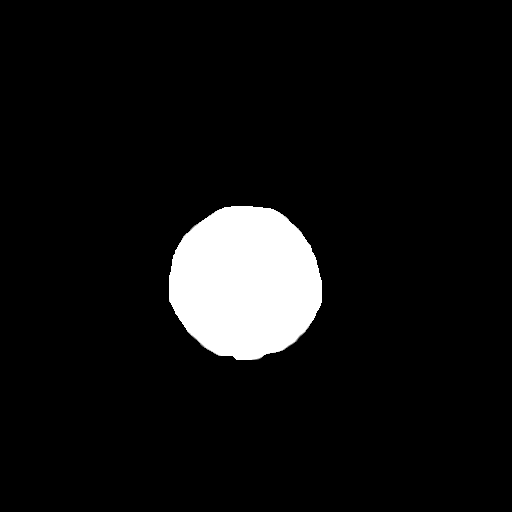

[Series 3: head bone · axial · 0.46mm/px · z∈[-392,-360]mm · 3 of 83 slices shown]
[im 9/83  bone]
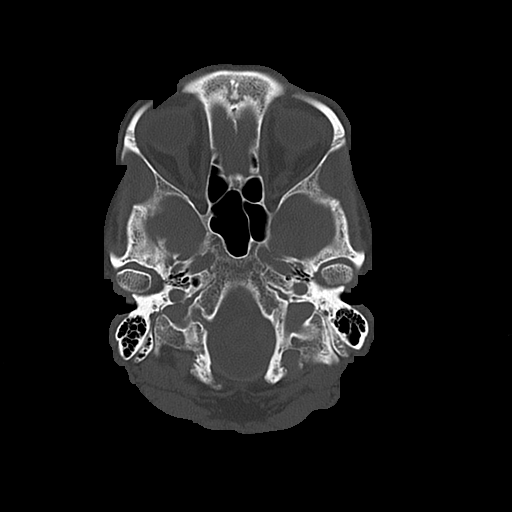
[im 17/83  bone]
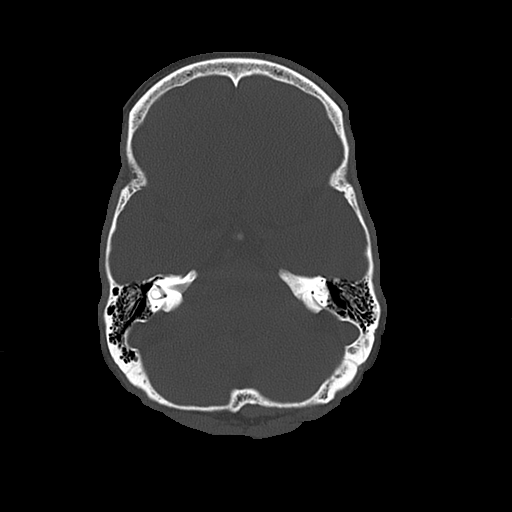
[im 25/83  bone]
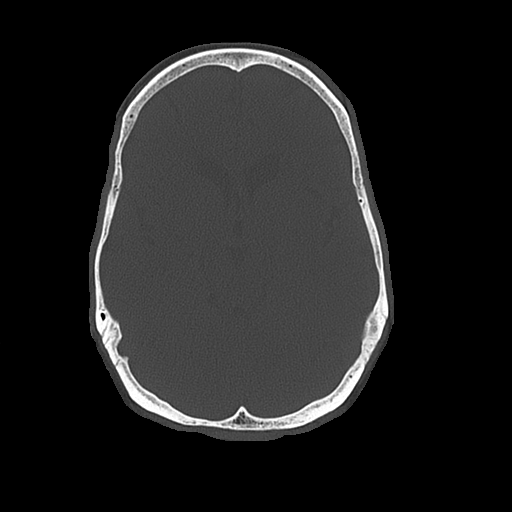

[Series 4: coronal soft · coronal · 0.33mm/px · 3 of 68 slices shown]
[im 23/68  brain]
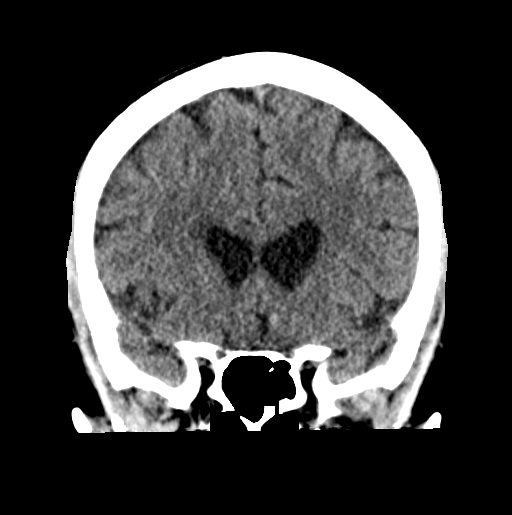
[im 30/68  brain]
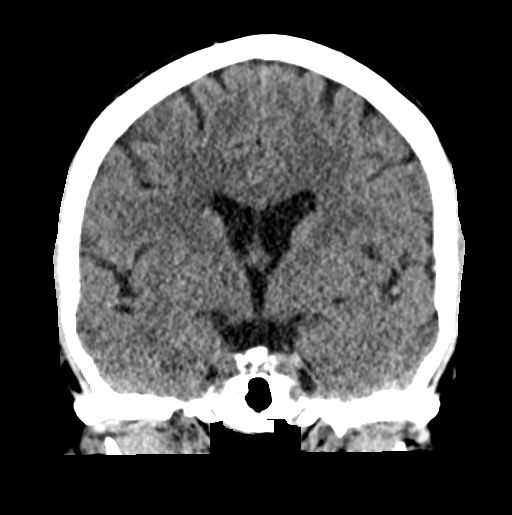
[im 38/68  brain]
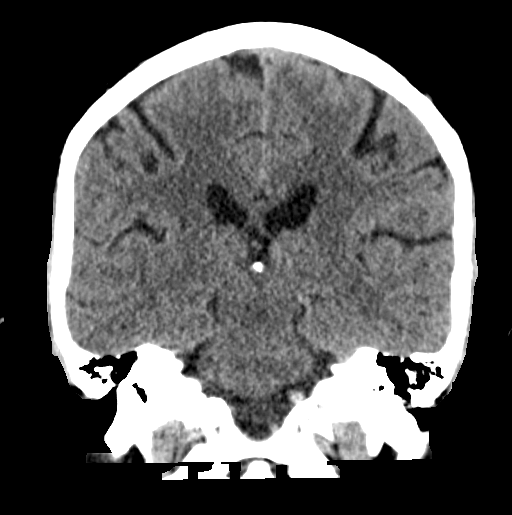

[Series 5: sagittal soft · sagittal · 0.33mm/px · 3 of 57 slices shown]
[im 19/57  brain]
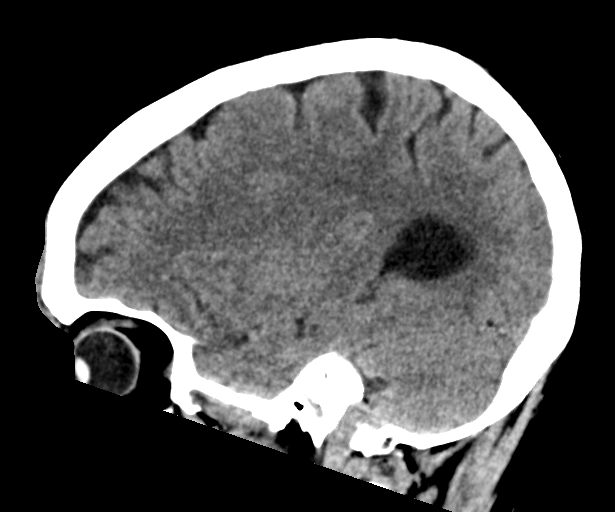
[im 29/57  brain]
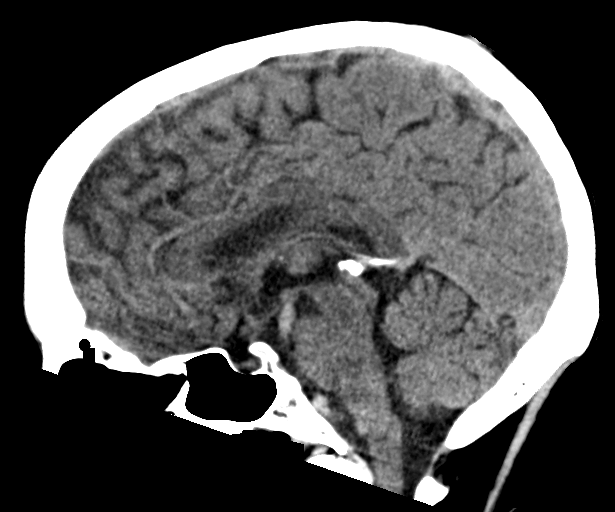
[im 38/57  brain]
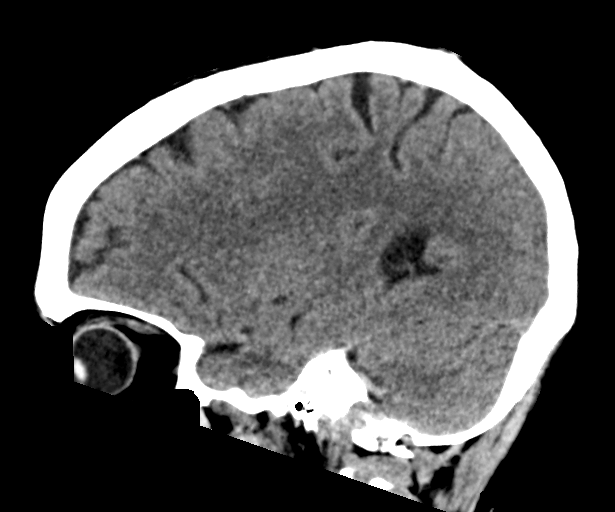

[16 of 47 positions shown; findings below may reference images not displayed]

FINDINGS: Brain: The ventricles are normal in size and configuration. The
basilar cisterns are patent.

No mass, mass effect, or midline shift.

No acute intracranial hemorrhage is seen.

No abnormal extra-axial fluid collection.

Preservation of the normal cortical gray-white interface without CT
evidence of an acute major vascular territorial cortical based
infarction.

Vascular: No hyperdense vessel or unexpected calcification.

Skull: Normal. Negative for fracture or focal lesion.

Sinuses/Orbits: The visualized orbits are unremarkable. The
visualized paranasal sinuses and mastoid air cells are clear.

Other: There is a moderate hematoma within the left forehead soft
tissues measuring up to 13 mm in AP thickness, 19 mm in craniocaudal
dimension, and 20 mm in transverse dimension. Moderate surrounding
soft tissue swelling.
IMPRESSION: :
IMPRESSION: 1. Moderate left forehead soft tissue swelling and hematoma.
2. No acute intracranial process.

## 2023-05-18 IMAGING — CT CT CERVICAL SPINE W/O CM
3 of 4 series · 12 of 33 positions shown, 14 images · non-contrast
Comparison: None.

CLINICAL DATA: Neck trauma.  Fell, hitting parking meter with head.



[Series 5: cor bone · coronal · 0.29mm/px · 3 of 61 slices shown]
[im 13/61  bone]
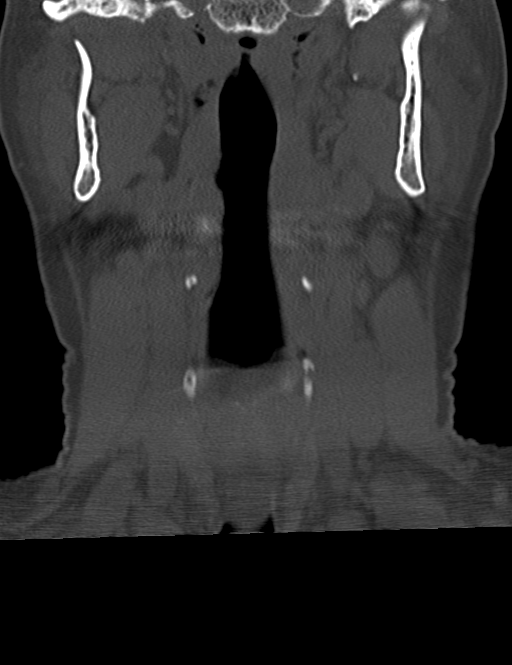
[im 25/61  bone]
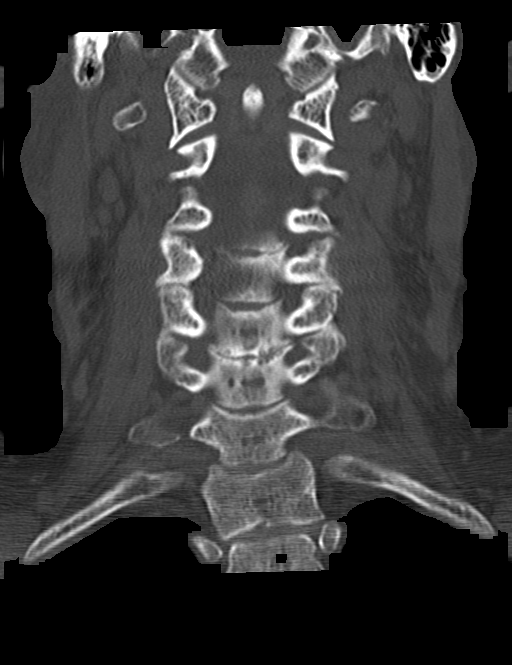
[im 36/61  bone]
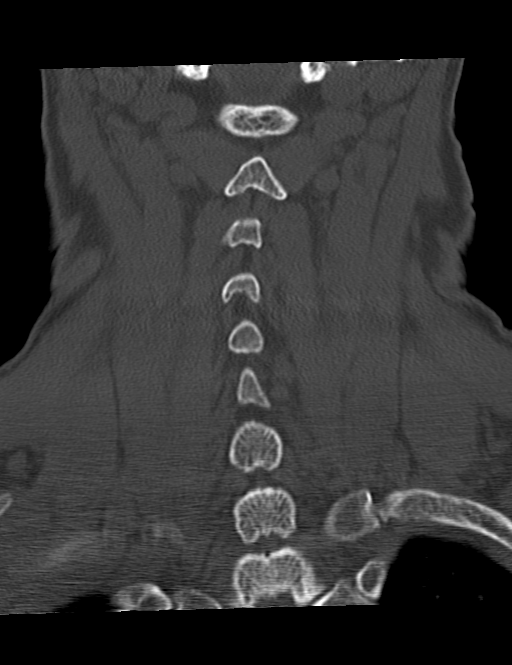

[Series 6: sag bone · sagittal · 0.24mm/px · 5 of 74 slices shown, 6 images]
[im 25/74  bone]
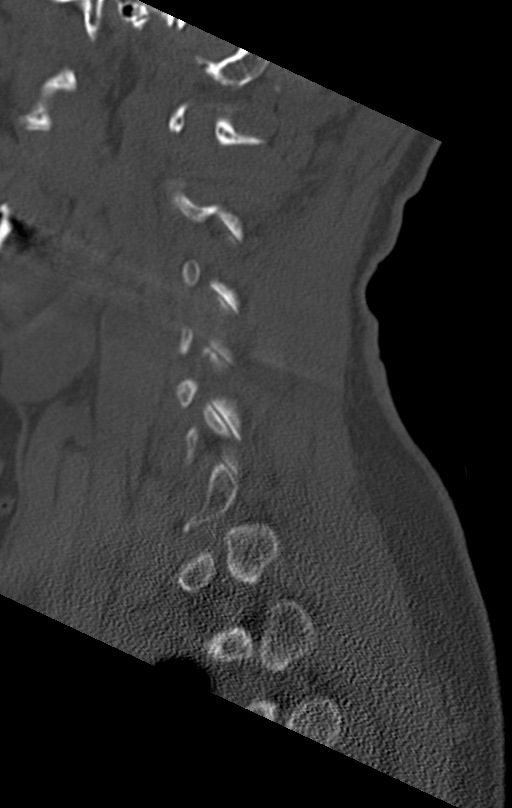
[im 31/74  bone]
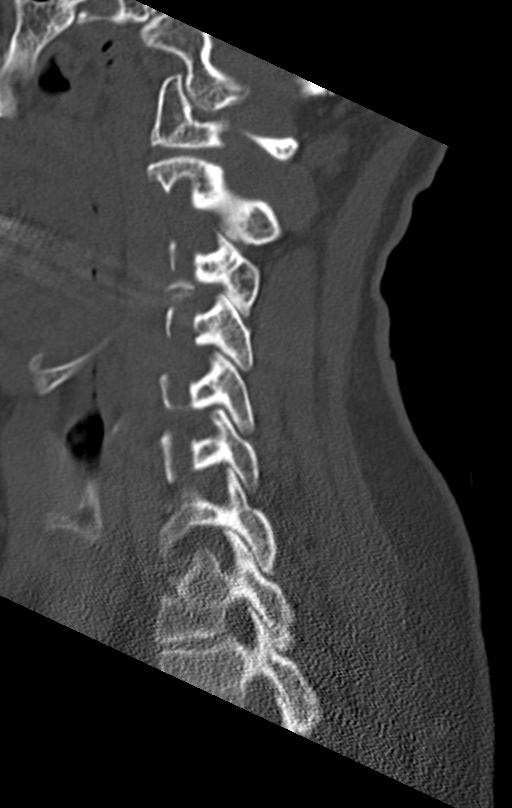
[im 37/74  soft-tissue]
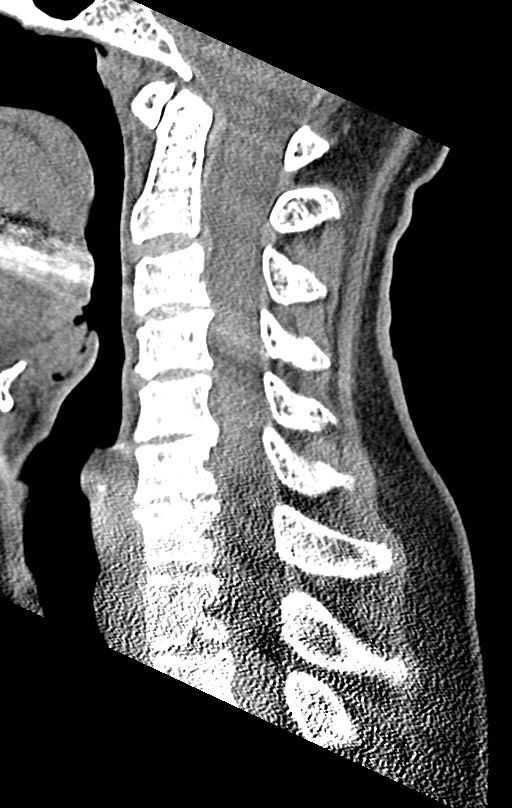
[im 37/74  bone]
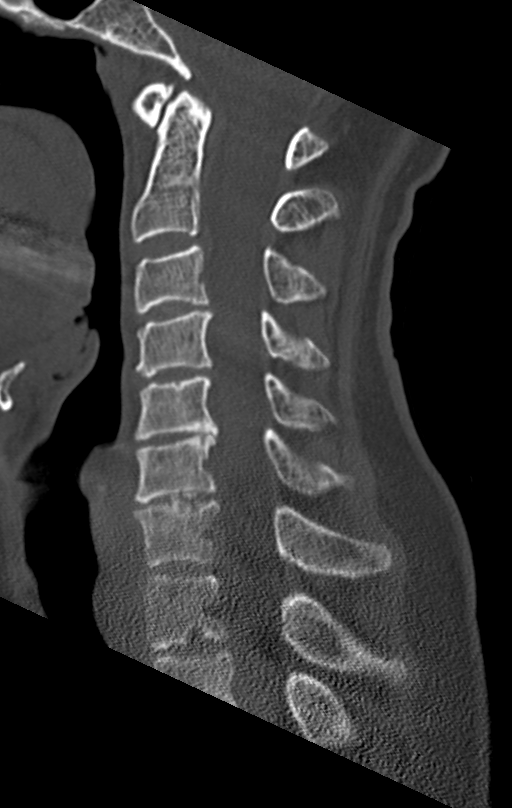
[im 43/74  bone]
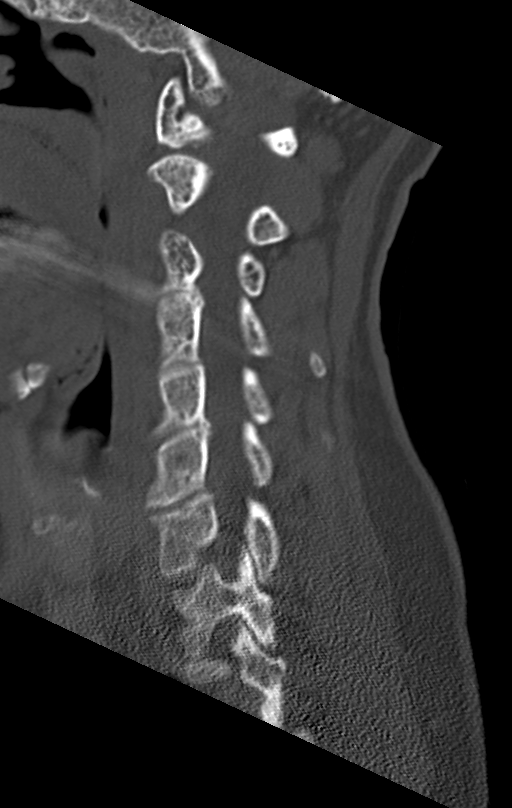
[im 49/74  bone]
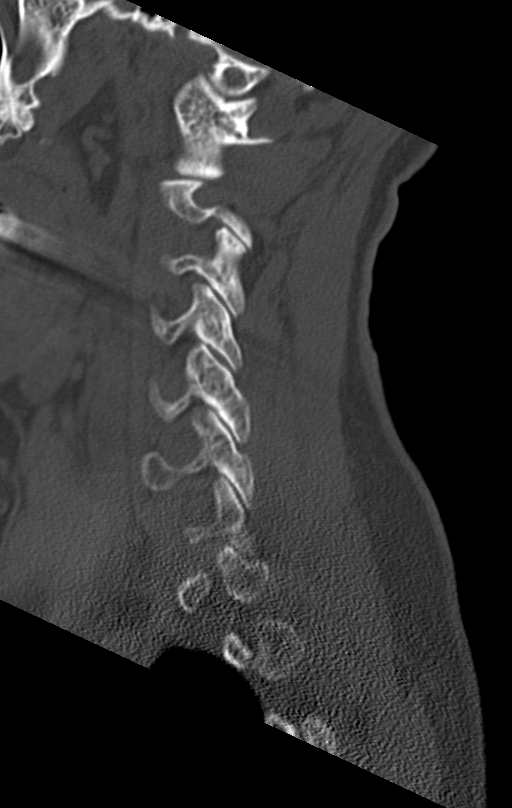

[Series 7: orthogonal axials · axial · 0.21mm/px · z∈[-529,-422]mm · 4 of 81 slices shown, 5 images]
[im 12/81  soft-tissue]
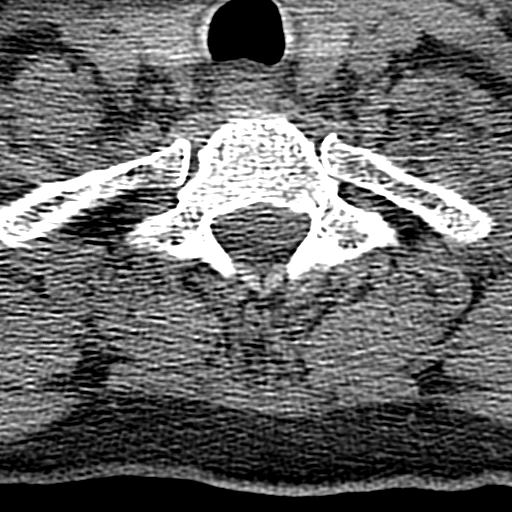
[im 12/81  bone]
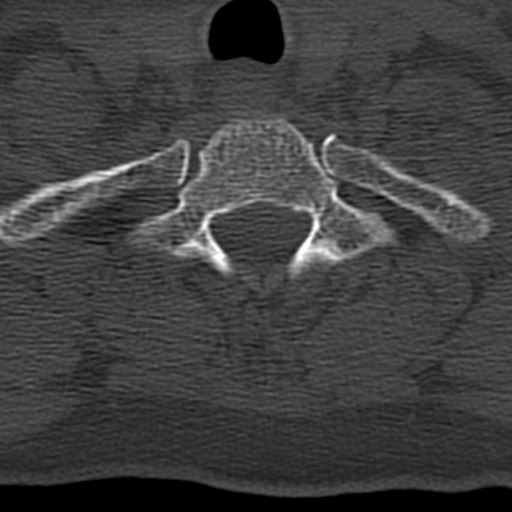
[im 35/81  bone]
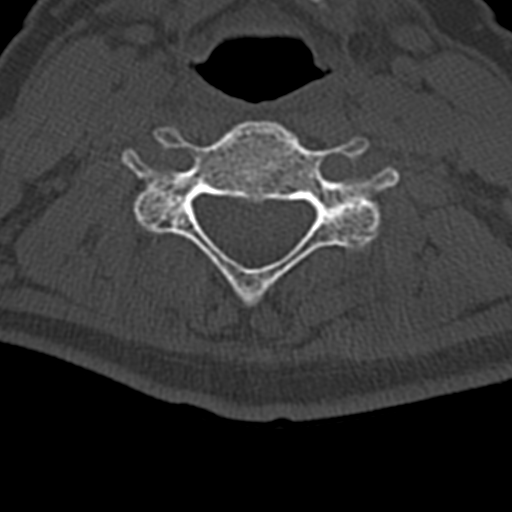
[im 46/81  bone]
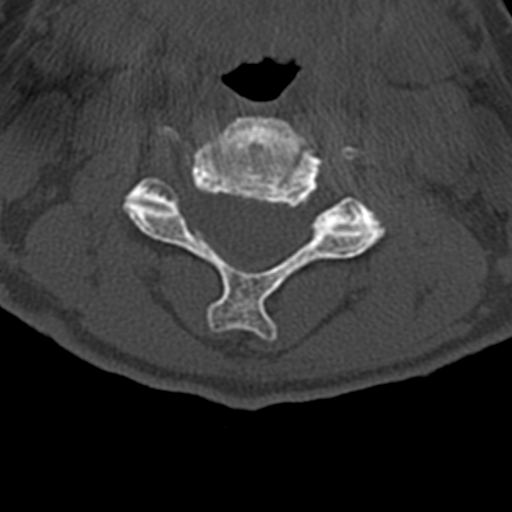
[im 69/81  bone]
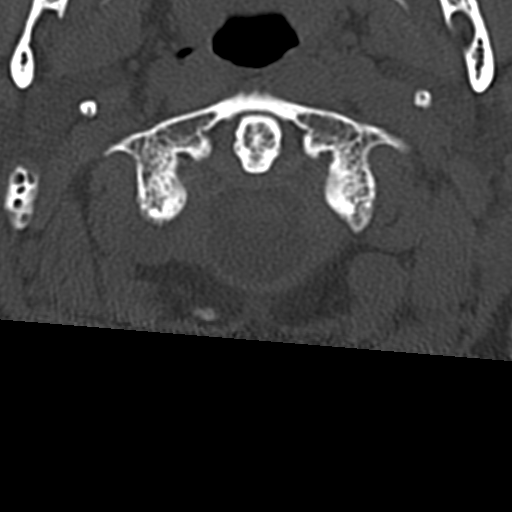

[12 of 33 positions shown; findings below may reference images not displayed]

FINDINGS: Alignment: There is straightening of the normal cervical lordosis.
No sagittal spondylolisthesis. The facet joints are normally
aligned.

Skull base and vertebrae: The atlantodens interval is intact.
Vertebral body heights are maintained. Mild posterior C3-4, moderate
posterior C5-6, moderate to severe diffuse C6-7, and mild anterior
greater than posterior T1-2 disc space narrowing.

No acute fracture is seen.

Soft tissues and spinal canal: No prevertebral fluid or swelling. No
visible canal hematoma.

Disc levels: Multilevel degenerative changes including disc space
narrowing, uncovertebral hypertrophy and facet joint hypertrophy
contribute to mild left-greater-than-right C3-4 and moderate to
severe left C5-6 neuroforaminal stenosis. No significant central
canal stenosis.

Upper chest: The lung apices are unremarkable.

Other: No cervical chain lymphadenopathy.
IMPRESSION: :
IMPRESSION: 1. No acute fracture is seen.
2. Straightening of the normal cervical lordosis.
3. Degenerative disc and endplate changes greatest at C6-7.
4. Mild left-greater-than-right C3-4 and moderate to severe left
C5-6 neuroforaminal stenosis.

## 2023-08-04 DIAGNOSIS — H40023 Open angle with borderline findings, high risk, bilateral: Secondary | ICD-10-CM | POA: Diagnosis not present

## 2023-08-04 DIAGNOSIS — H43813 Vitreous degeneration, bilateral: Secondary | ICD-10-CM | POA: Diagnosis not present

## 2023-08-04 DIAGNOSIS — D3132 Benign neoplasm of left choroid: Secondary | ICD-10-CM | POA: Diagnosis not present

## 2023-10-27 DIAGNOSIS — Z0000 Encounter for general adult medical examination without abnormal findings: Secondary | ICD-10-CM | POA: Diagnosis not present

## 2023-10-27 DIAGNOSIS — Z7184 Encounter for health counseling related to travel: Secondary | ICD-10-CM | POA: Diagnosis not present

## 2023-10-27 DIAGNOSIS — Z23 Encounter for immunization: Secondary | ICD-10-CM | POA: Diagnosis not present

## 2023-12-10 ENCOUNTER — Observation Stay (HOSPITAL_COMMUNITY): Payer: Medicare Other

## 2023-12-10 ENCOUNTER — Emergency Department (HOSPITAL_COMMUNITY): Payer: Medicare Other

## 2023-12-10 ENCOUNTER — Other Ambulatory Visit: Payer: Self-pay | Admitting: Cardiology

## 2023-12-10 ENCOUNTER — Observation Stay (HOSPITAL_COMMUNITY)
Admission: EM | Admit: 2023-12-10 | Discharge: 2023-12-10 | Disposition: A | Discharge status: Home / Self Care | Payer: Medicare Other | Attending: Internal Medicine | Admitting: Internal Medicine

## 2023-12-10 ENCOUNTER — Encounter (HOSPITAL_COMMUNITY): Payer: Self-pay

## 2023-12-10 DIAGNOSIS — I4891 Unspecified atrial fibrillation: Secondary | ICD-10-CM | POA: Diagnosis not present

## 2023-12-10 DIAGNOSIS — D751 Secondary polycythemia: Secondary | ICD-10-CM | POA: Diagnosis not present

## 2023-12-10 DIAGNOSIS — I480 Paroxysmal atrial fibrillation: Secondary | ICD-10-CM | POA: Diagnosis present

## 2023-12-10 DIAGNOSIS — I341 Nonrheumatic mitral (valve) prolapse: Secondary | ICD-10-CM | POA: Diagnosis not present

## 2023-12-10 DIAGNOSIS — I499 Cardiac arrhythmia, unspecified: Secondary | ICD-10-CM | POA: Diagnosis not present

## 2023-12-10 DIAGNOSIS — R079 Chest pain, unspecified: Secondary | ICD-10-CM | POA: Diagnosis not present

## 2023-12-10 DIAGNOSIS — R7989 Other specified abnormal findings of blood chemistry: Secondary | ICD-10-CM | POA: Diagnosis not present

## 2023-12-10 DIAGNOSIS — I700 Atherosclerosis of aorta: Secondary | ICD-10-CM | POA: Insufficient documentation

## 2023-12-10 DIAGNOSIS — E876 Hypokalemia: Secondary | ICD-10-CM

## 2023-12-10 DIAGNOSIS — R7401 Elevation of levels of liver transaminase levels: Secondary | ICD-10-CM | POA: Insufficient documentation

## 2023-12-10 DIAGNOSIS — I5189 Other ill-defined heart diseases: Secondary | ICD-10-CM | POA: Diagnosis not present

## 2023-12-10 DIAGNOSIS — I10 Essential (primary) hypertension: Secondary | ICD-10-CM | POA: Diagnosis not present

## 2023-12-10 DIAGNOSIS — R0689 Other abnormalities of breathing: Secondary | ICD-10-CM | POA: Diagnosis not present

## 2023-12-10 DIAGNOSIS — R000 Tachycardia, unspecified: Secondary | ICD-10-CM | POA: Diagnosis not present

## 2023-12-10 DIAGNOSIS — R002 Palpitations: Secondary | ICD-10-CM | POA: Diagnosis present

## 2023-12-10 HISTORY — DX: Cardiac murmur, unspecified: R01.1

## 2023-12-10 LAB — TROPONIN I (HIGH SENSITIVITY)
Troponin I (High Sensitivity): 17 ng/L (ref <18)
Troponin I (High Sensitivity): 20 ng/L — ABNORMAL HIGH (ref <18)
Troponin I (High Sensitivity): 21 ng/L — ABNORMAL HIGH (ref <18)

## 2023-12-10 LAB — LIPID PANEL
Cholesterol: 192 mg/dL (ref 0–200)
HDL: 55 mg/dL (ref >40)
LDL Cholesterol: 120 mg/dL — ABNORMAL HIGH (ref 0–99)
Total CHOL/HDL Ratio: 3.5 {ratio}
Triglycerides: 86 mg/dL (ref <150)
VLDL: 17 mg/dL (ref 0–40)

## 2023-12-10 LAB — CBC
HCT: 46.5 % — ABNORMAL HIGH (ref 36.0–46.0)
Hemoglobin: 15.4 g/dL — ABNORMAL HIGH (ref 12.0–15.0)
MCH: 29.7 pg (ref 26.0–34.0)
MCHC: 33.1 g/dL (ref 30.0–36.0)
MCV: 89.6 fL (ref 80.0–100.0)
Platelets: 257 10*3/uL (ref 150–400)
RBC: 5.19 MIL/uL — ABNORMAL HIGH (ref 3.87–5.11)
RDW: 12.5 % (ref 11.5–15.5)
WBC: 6.1 10*3/uL (ref 4.0–10.5)
nRBC: 0 % (ref 0.0–0.2)

## 2023-12-10 LAB — BASIC METABOLIC PANEL
Anion gap: 10 (ref 5–15)
BUN: 13 mg/dL (ref 8–23)
CO2: 20 mmol/L — ABNORMAL LOW (ref 22–32)
Calcium: 9.2 mg/dL (ref 8.9–10.3)
Chloride: 111 mmol/L (ref 98–111)
Creatinine, Ser: 0.57 mg/dL (ref 0.44–1.00)
GFR, Estimated: >60 mL/min (ref >60)
Glucose, Bld: 122 mg/dL — ABNORMAL HIGH (ref 70–99)
Potassium: 3.4 mmol/L — ABNORMAL LOW (ref 3.5–5.1)
Sodium: 141 mmol/L (ref 135–145)

## 2023-12-10 LAB — TSH: TSH: 4.778 u[IU]/mL — ABNORMAL HIGH (ref 0.350–4.500)

## 2023-12-10 LAB — BRAIN NATRIURETIC PEPTIDE: B Natriuretic Peptide: 63.8 pg/mL (ref 0.0–100.0)

## 2023-12-10 LAB — MAGNESIUM: Magnesium: 2 mg/dL (ref 1.7–2.4)

## 2023-12-10 MED ORDER — SODIUM CHLORIDE 0.9% FLUSH: 3.0000 mL | Freq: Two times a day (BID) | INTRAVENOUS | Status: DC

## 2023-12-10 MED ORDER — APIXABAN 5 MG PO TABS: 5.0000 mg | ORAL_TABLET | Freq: Two times a day (BID) | ORAL | 1 refills | Status: DC

## 2023-12-10 MED ORDER — DILTIAZEM HCL 30 MG PO TABS: 30.0000 mg | ORAL_TABLET | Freq: Four times a day (QID) | ORAL | 0 refills | Status: DC | PRN

## 2023-12-10 MED ORDER — POTASSIUM CHLORIDE CRYS ER 20 MEQ PO TBCR
60.0000 meq | EXTENDED_RELEASE_TABLET | ORAL | Status: AC
  Administered 2023-12-10: 60 meq via ORAL
  Filled 2023-12-10: qty 3

## 2023-12-10 MED ORDER — ALBUTEROL SULFATE (2.5 MG/3ML) 0.083% IN NEBU: 2.5000 mg | INHALATION_SOLUTION | Freq: Four times a day (QID) | RESPIRATORY_TRACT | Status: DC | PRN

## 2023-12-10 MED ORDER — DILTIAZEM HCL ER COATED BEADS 120 MG PO CP24: 120.0000 mg | ORAL_CAPSULE | Freq: Every day | ORAL | 3 refills | Status: DC

## 2023-12-10 MED ORDER — HYDRALAZINE HCL 20 MG/ML IJ SOLN: 10.0000 mg | INTRAMUSCULAR | Status: DC | PRN

## 2023-12-10 MED ORDER — ACETAMINOPHEN 650 MG RE SUPP: 650.0000 mg | Freq: Four times a day (QID) | RECTAL | Status: DC | PRN

## 2023-12-10 MED ORDER — ONDANSETRON HCL 4 MG PO TABS: 4.0000 mg | ORAL_TABLET | Freq: Four times a day (QID) | ORAL | Status: DC | PRN

## 2023-12-10 MED ORDER — APIXABAN 5 MG PO TABS
5.0000 mg | ORAL_TABLET | Freq: Two times a day (BID) | ORAL | Status: DC
  Administered 2023-12-10: 5 mg via ORAL
  Filled 2023-12-10: qty 1

## 2023-12-10 MED ORDER — ONDANSETRON HCL 4 MG/2ML IJ SOLN: 4.0000 mg | Freq: Four times a day (QID) | INTRAMUSCULAR | Status: DC | PRN

## 2023-12-10 MED ORDER — ACETAMINOPHEN 325 MG PO TABS: 650.0000 mg | ORAL_TABLET | Freq: Four times a day (QID) | ORAL | Status: DC | PRN

## 2023-12-10 MED ORDER — DILTIAZEM HCL-DEXTROSE 125-5 MG/125ML-% IV SOLN (PREMIX)
5.0000 mg/h | INTRAVENOUS | Status: DC
  Administered 2023-12-10: 5 mg/h via INTRAVENOUS
  Filled 2023-12-10: qty 125

## 2023-12-10 MED ORDER — DILTIAZEM LOAD VIA INFUSION
10.0000 mg | Freq: Once | INTRAVENOUS | Status: AC
  Administered 2023-12-10: 10 mg via INTRAVENOUS
  Filled 2023-12-10: qty 10

## 2023-12-10 NOTE — ED Triage Notes (Signed)
Pt BIB [facility] from home d/t Jaw pain and felt heart fluttering.  She took out her mouth guard and jaw pain dissipated but heart continued.  HR with EMS was 90-170.  New onset -a ll approx 0230.  EMS gave 450 cc NS.

## 2023-12-10 NOTE — ED Notes (Signed)
ED TO INPATIENT HANDOFF REPORT  ED Nurse Name and Phone #: Reece Leader Name/Age/Gender Monica Lowe 72 y.o. female Room/Bed: TRAAC/TRAAC  Code Status   Code Status: Full Code  Home/SNF/Other Home Patient oriented to: self, place, time, and situation Is this baseline? Yes   Triage Complete: Triage complete  Chief Complaint New onset a-fib [facility]) [I48.91]  Triage Note Pt BIB [facility] from home d/t Jaw pain and felt heart fluttering.  She took out her mouth guard and jaw pain dissipated but heart continued.  HR with EMS was 90-170.  New onset -a ll approx 0230.  EMS gave 450 cc NS.   Allergies Allergies  Allergen Reactions   Sulfa Antibiotics Rash    Level of Care/Admitting Diagnosis ED Disposition     ED Disposition  Admit   Condition  --   Comment  Hospital Area: [facility] [[id number]]  Level of Care: Telemetry Cardiac [103]  May place patient in observation at [facility] or [facility] if equivalent level of care is available:: Yes  Covid Evaluation: Asymptomatic - no recent exposure (last 10 days) testing not required  Diagnosis: New onset a-fib [facility]) [[id number]]  Admitting Physician: Darlin Drop [[id number]]  Attending Physician: Darlin Drop [[id number]]          B Medical/Surgery History Past Medical History:  Diagnosis Date   Cardiac murmur    Past Surgical History:  Procedure Laterality Date   ABDOMINAL HYSTERECTOMY       A IV Location/Drains/Wounds Patient Lines/Drains/Airways Status     Active Line/Drains/Airways     Name Placement date Placement time Site Days   Peripheral IV 12/10/23 20 G 1" Left Antecubital 12/10/23  0325  Antecubital  less than 1            Intake/Output Last 24 hours No intake or output data in the 24 hours ending 12/10/23 1308  Labs/Imaging Results for orders placed or performed during the hospital encounter of 12/10/23 (from the past 48 hours)  Troponin I (High Sensitivity)     Status:  None   Collection Time: 12/10/23  4:10 AM  Result Value Ref Range   Troponin I (High Sensitivity) 17 <18 ng/L    Comment: (NOTE) Elevated high sensitivity troponin I (hsTnI) values and significant  changes across serial measurements may suggest ACS but many other  chronic and acute conditions are known to elevate hsTnI results.  Refer to the "Links" section for chest pain algorithms and additional  guidance. Performed at [facility], [address] [place], [place] [address]   Basic metabolic panel     Status: Abnormal   Collection Time: 12/10/23  4:10 AM  Result Value Ref Range   Sodium 141 135 - 145 mmol/L   Potassium 3.4 (L) 3.5 - 5.1 mmol/L   Chloride 111 98 - 111 mmol/L   CO2 20 (L) 22 - 32 mmol/L   Glucose, Bld 122 (H) 70 - 99 mg/dL    Comment: Glucose reference range applies only to samples taken after fasting for at least 8 hours.   BUN 13 8 - 23 mg/dL   Creatinine, Ser 1.30 0.44 - 1.00 mg/dL   Calcium 9.2 8.9 - 86.5 mg/dL   GFR, Estimated >78 >46 mL/min    Comment: (NOTE) Calculated using the CKD-EPI Creatinine Equation (2021)    Anion gap 10 5 - 15    Comment: Performed at [facility], [address], [place], [place]  [address]  Magnesium     Status: None   Collection Time: 12/10/23  4:10 AM  Result Value Ref Range   Magnesium 2.0 1.7 - 2.4 mg/dL    Comment: Performed at [facility], [address] [place], [place] [address]  CBC     Status: Abnormal   Collection Time: 12/10/23  4:10 AM  Result Value Ref Range   WBC 6.1 4.0 - 10.5 K/uL   RBC 5.19 (H) 3.87 - 5.11 MIL/uL   Hemoglobin 15.4 (H) 12.0 - 15.0 g/dL   HCT 09.8 (H) 11.9 - 14.7 %   MCV 89.6 80.0 - 100.0 fL   MCH 29.7 26.0 - 34.0 pg   MCHC 33.1 30.0 - 36.0 g/dL   RDW 82.9 56.2 - 13.0 %   Platelets 257 150 - 400 K/uL   nRBC 0.0 0.0 - 0.2 %    Comment: Performed at [facility], [address] [place], [place] [address]  TSH     Status: Abnormal   Collection Time: 12/10/23   4:10 AM  Result Value Ref Range   TSH 4.778 (H) 0.350 - 4.500 uIU/mL    Comment: Performed by a 3rd Generation assay with a functional sensitivity of <=0.01 uIU/mL. Performed at [facility], [address] [place], [place] [address]   Brain natriuretic peptide     Status: None   Collection Time: 12/10/23  4:10 AM  Result Value Ref Range   B Natriuretic Peptide 63.8 0.0 - 100.0 pg/mL    Comment: Performed at [facility], [address], [place], [place] [address]  Troponin I (High Sensitivity)     Status: Abnormal   Collection Time: 12/10/23  6:13 AM  Result Value Ref Range   Troponin I (High Sensitivity) 20 (H) <18 ng/L    Comment: (NOTE) Elevated high sensitivity troponin I (hsTnI) values and significant  changes across serial measurements may suggest ACS but many other  chronic and acute conditions are known to elevate hsTnI results.  Refer to the "Links" section for chest pain algorithms and additional  guidance. Performed at [facility], [address], [place], [place] [address]   Lipid panel     Status: Abnormal   Collection Time: 12/10/23  6:13 AM  Result Value Ref Range   Cholesterol 192 0 - 200 mg/dL   Triglycerides 86 <010 mg/dL   HDL 55 >27 mg/dL   Total CHOL/HDL Ratio 3.5 RATIO   VLDL 17 0 - 40 mg/dL   LDL Cholesterol 253 (H) 0 - 99 mg/dL    Comment:        Total Cholesterol/HDL:CHD Risk Coronary Heart Disease Risk Table                     Men   Women  1/2 Average Risk   3.4   3.3  Average Risk       5.0   4.4  2 X Average Risk   9.6   7.1  3 X Average Risk  23.4   11.0        Use the calculated Patient Ratio above and the CHD Risk Table to determine the patient's CHD Risk.        ATP III CLASSIFICATION (LDL):  <100     mg/dL   Optimal  664-403  mg/dL   Near or Above  Optimal  130-159  mg/dL   Borderline  644-034  mg/dL   High  >742     mg/dL   Very High Performed at [facility], [address]  [place], [place] [address]    DG Chest Portable 1 View Result Date: 12/10/2023 CLINICAL DATA:  Chest pain. EXAM: PORTABLE CHEST 1 VIEW COMPARISON:  None Available. FINDINGS: The heart size and mediastinal contours are within normal limits. There is aortic atherosclerosis. Both lungs are clear. The visualized skeletal structures are intact, with osteopenia. Multiple overlying monitor wires. IMPRESSION: No evidence of acute chest disease. Aortic atherosclerosis. Electronically Signed   By: Almira Bar M.D.   On: 12/10/2023 05:05    Pending Labs Unresulted Labs (From admission, onward)     Start     Ordered   12/11/23 0500  CBC  Tomorrow morning,   R        12/10/23 0642   12/11/23 0500  Basic metabolic panel  Tomorrow morning,   R        12/10/23 0642   12/11/23 0500  Hemoglobin A1c  Tomorrow morning,   R        12/10/23 1253   12/10/23 1257  T4, free  Add-on,   AD        12/10/23 1256            Vitals/Pain Today's Vitals   12/10/23 1134 12/10/23 1230 12/10/23 1257 12/10/23 1303  BP: (!) 149/81 (!) 155/82    Pulse: 82 84    Resp: 19 16    Temp:   98.1 F (36.7 C) 98.2 F (36.8 C)  TempSrc:   Oral Oral  SpO2: 96% 98%    Weight:      Height:      PainSc:        Isolation Precautions No active isolations  Medications Medications  diltiazem (CARDIZEM) 1 mg/mL load via infusion 10 mg (10 mg Intravenous Bolus from Bag 12/10/23 0428)    And  diltiazem (CARDIZEM) 125 mg in dextrose 5% 125 mL (1 mg/mL) infusion (5 mg/hr Intravenous New Bag/Given 12/10/23 0422)  apixaban (ELIQUIS) tablet 5 mg (5 mg Oral Given 12/10/23 1131)  sodium chloride flush (NS) 0.9 % injection 3 mL (3 mLs Intravenous Not Given 12/10/23 8756)  acetaminophen (TYLENOL) tablet 650 mg (has no administration in time range)    Or  acetaminophen (TYLENOL) suppository 650 mg (has no administration in time range)  ondansetron (ZOFRAN) tablet 4 mg (has no administration in time range)    Or  ondansetron  (ZOFRAN) injection 4 mg (has no administration in time range)  albuterol (PROVENTIL) (2.5 MG/3ML) 0.083% nebulizer solution 2.5 mg (has no administration in time range)  hydrALAZINE (APRESOLINE) injection 10 mg (has no administration in time range)  potassium chloride SA (KLOR-CON M) CR tablet 60 mEq (60 mEq Oral Given 12/10/23 0724)    Mobility walks     Focused Assessments Cardiac Assessment Handoff:  Cardiac Rhythm: Atrial fibrillation No results found for: "CKTOTAL", "CKMB", "CKMBINDEX", "TROPONINI" No results found for: "DDIMER" Does the Patient currently have chest pain? No    R Recommendations: See Admitting Provider Note  Report given to:   Additional Notes:

## 2023-12-10 NOTE — ED Provider Notes (Signed)
[place] EMERGENCY DEPARTMENT AT [facility] Provider Note   CSN: [id number] Arrival date & time: 12/10/23  0345     History  No chief complaint on file.   Monica Lowe is a 72 y.o. female.  HPI     This is a 72 year old otherwise healthy female who presents with concern for new onset atrial fibrillation.  Patient reports that she woke up feeling like her heart was palpitating.  She was also having jaw pain.  She removed her mouthguard and jaw pain resolved.  However palpitations continued.  No history of arrhythmia or tachycardia in the past.  Denies history of heart disease.  Does report that she drank 2 alcoholic beverages last night which is out of her norm.  Denies any other medication or dietary changes.  Home Medications Prior to Admission medications   Not on File      Allergies    Sulfa antibiotics    Review of Systems   Review of Systems  Constitutional:  Negative for fever.  Respiratory:  Negative for shortness of breath.   Cardiovascular:  Positive for palpitations. Negative for chest pain.  All other systems reviewed and are negative.   Physical Exam Updated Vital Signs BP (!) 141/69   Pulse 96   Temp 98.2 F (36.8 C) (Oral)   Resp 16   Ht 1.727 m (5\' 8" )   Wt 63.5 kg   SpO2 95%   BMI 21.29 kg/m  Physical Exam Vitals and nursing note reviewed.  Constitutional:      Appearance: She is well-developed. She is not ill-appearing.  HENT:     Head: Normocephalic and atraumatic.  Eyes:     Pupils: Pupils are equal, round, and reactive to light.  Cardiovascular:     Rate and Rhythm: Tachycardia present. Rhythm irregular.     Heart sounds: Normal heart sounds.  Pulmonary:     Effort: Pulmonary effort is normal. No respiratory distress.     Breath sounds: No wheezing.  Abdominal:     General: Bowel sounds are normal.     Palpations: Abdomen is soft.  Musculoskeletal:     Cervical back: Neck supple.     Right lower leg: No edema.      Left lower leg: No edema.  Skin:    General: Skin is warm and dry.  Neurological:     Mental Status: She is alert and oriented to person, place, and time.  Psychiatric:        Mood and Affect: Mood normal.     ED Results / Procedures / Treatments   Labs (all labs ordered are listed, but only abnormal results are displayed) Labs Reviewed  BASIC METABOLIC PANEL - Abnormal; Notable for the following components:      Result Value   Potassium 3.4 (*)    CO2 20 (*)    Glucose, Bld 122 (*)    All other components within normal limits  CBC - Abnormal; Notable for the following components:   RBC 5.19 (*)    Hemoglobin 15.4 (*)    HCT 46.5 (*)    All other components within normal limits  MAGNESIUM  TROPONIN I (HIGH SENSITIVITY)  TROPONIN I (HIGH SENSITIVITY)    EKG EKG Interpretation Date/Time:  Thursday December 10 2023 03:58:38 EST Ventricular Rate:  155 PR Interval:    QRS Duration:  91 QT Interval:  308 QTC Calculation: 495 R Axis:   59  Text Interpretation: Atrial fibrillation with rapid V-rate ST  depression, probably rate related Confirmed by Monica Lowe ([id number]) on 12/10/2023 4:23:39 AM  Radiology DG Chest Portable 1 View Result Date: 12/10/2023 CLINICAL DATA:  Chest pain. EXAM: PORTABLE CHEST 1 VIEW COMPARISON:  None Available. FINDINGS: The heart size and mediastinal contours are within normal limits. There is aortic atherosclerosis. Both lungs are clear. The visualized skeletal structures are intact, with osteopenia. Multiple overlying monitor wires. IMPRESSION: No evidence of acute chest disease. Aortic atherosclerosis. Electronically Signed   By: Almira Bar M.D.   On: 12/10/2023 05:05    Procedures .Critical Care  Performed by: Shon Baton, MD Authorized by: Shon Baton, MD   Critical care provider statement:    Critical care time (minutes):  40   Critical care was necessary to treat or prevent imminent or life-threatening  deterioration of the following conditions: atrial fibrillation.   Critical care was time spent personally by me on the following activities:  Development of treatment plan with patient or surrogate, discussions with consultants, evaluation of patient's response to treatment, examination of patient, ordering and review of laboratory studies, ordering and review of radiographic studies, ordering and performing treatments and interventions, pulse oximetry, re-evaluation of patient's condition and review of old charts     Medications Ordered in ED Medications  diltiazem (CARDIZEM) 1 mg/mL load via infusion 10 mg (10 mg Intravenous Bolus from Bag 12/10/23 0428)    And  diltiazem (CARDIZEM) 125 mg in dextrose 5% 125 mL (1 mg/mL) infusion (5 mg/hr Intravenous New Bag/Given 12/10/23 0422)    ED Course/ Medical Decision Making/ A&P                                 Medical Decision Making Amount and/or Complexity of Data Reviewed Labs: ordered. Radiology: ordered.  Risk Prescription drug management. Decision regarding hospitalization.   This patient presents to the ED for concern of palpitations, this involves an extensive number of treatment options, and is a complaint that carries with it a high risk of complications and morbidity.  I considered the following differential and admission for this acute, potentially life threatening condition.  The differential diagnosis includes arrhythmia, atrial fibrillation, ACS  MDM:    This is a 72 year old female who presents with palpitations.  She is overall nontoxic-appearing.  Noted to be in atrial fibrillation with RVR.  No history of the same.  No significant cardiac history.  Patient was started on dill drip with good results and rates in the 90s.  She remains in atrial fibrillation.  Basic lab work obtained including metabolites which are largely reassuring.  Troponin negative.  Chest x-ray without pneumothorax or pneumonia.  Will plan for admission  for cardiology evaluation, echocardiogram.  Patient is agreeable to plan.  On multiple rechecks she is overall improved.  (Labs, imaging, consults)  Labs: I Ordered, and personally interpreted labs.  The pertinent results include: CBC, BMP, magnesium, troponin  Imaging Studies ordered: I ordered imaging studies including chest x-ray I independently visualized and interpreted imaging. I agree with the radiologist interpretation  Additional history obtained from chart review.  External records from outside source obtained and reviewed including prior evaluations  Cardiac Monitoring: The patient was maintained on a cardiac monitor.  If on the cardiac monitor, I personally viewed and interpreted the cardiac monitored which showed an underlying rhythm of: Atrial fibrillation  Reevaluation: After the interventions noted above, I reevaluated the patient and found that they have :improved  Social Determinants of Health:  lives independently  Disposition: Admit  Co morbidities that complicate the patient evaluation History reviewed. No pertinent past medical history.   Medicines Meds ordered this encounter  Medications   AND Linked Order Group    diltiazem (CARDIZEM) 1 mg/mL load via infusion 10 mg    diltiazem (CARDIZEM) 125 mg in dextrose 5% 125 mL (1 mg/mL) infusion    I have reviewed the patients home medicines and have made adjustments as needed  Problem List / ED Course: Problem List Items Addressed This Visit   None Visit Diagnoses       Atrial fibrillation with RVR (HCC)    -  Primary   Relevant Medications   diltiazem (CARDIZEM) 1 mg/mL load via infusion 10 mg (Completed)   diltiazem (CARDIZEM) 125 mg in dextrose 5% 125 mL (1 mg/mL) infusion                   Final Clinical Impression(s) / ED Diagnoses Final diagnoses:  Atrial fibrillation with RVR (HCC)    Rx / DC Orders ED Discharge Orders          Ordered    Amb referral to AFIB Clinic         12/10/23 0407              Shon Baton, MD 12/10/23 [phone]

## 2023-12-10 NOTE — Progress Notes (Signed)
Outpatient echo ordered per Dr. Anne Fu

## 2023-12-10 NOTE — Consult Note (Addendum)
CARDIOLOGY CONSULT NOTE  Patient ID: Monica Lowe MRN: [id number] DOB/AGE: 09-10-1951 72 y.o.  Admit date: 12/10/2023 Referring Physician: Dr. Madelyn Flavors  Primary Physician: Everlene Other  Primary Cardiologist: None  Reason for Consultation Afib with RVR   HPI: Monica Lowe is a very pleasant 72 year old female with no relevant past medical history nor any medications who presented with complaints of palpitations onset around 0200.  Patient declines any symptoms prior to this other than some accompanied jaw pain.  After the onset of jaw pain, she removed her retainer as she thought it may have been the cause of her jaw discomfort.  At that time, she got out of bed and continued to have feelings of her heart racing.  After approximately 45 minutes, she called her neighbor who asked her to call 911.  Patient denies any dyspnea, chest pain, diaphoresis, nausea, vomiting, diarrhea, fever, chills, weight gain, leg swelling, orthopnea.  Patient has reverted back into sinus rhythm, patient reports that she no longer feels palpitations and feels like things are more "clear" in her chest.   Patient has a history of mitral valve prolapse and received an echocardiogram in 2015 that showed normal left ventricular cavity size, normal systolic function, estimated EF of 60 to 65% with grade 1 diastolic dysfunction.   Patient denies history of change in medications, not taking any new types of foods and denies any change to her lifestyle.  Went to a social event yesterday and had 2 cocktails but normally does not drink alcohol and denies history of tobacco or drug use.  In the emergency department, the patient had A-fib with RVR, heart rates in the 150s with stable blood pressures.  Of note, patient had CBC with hemoglobin of 15.4, mag of 2, potassium of 3.4, troponin of 17 > 20, BNP was normal, TSH of 4.778, LDL 120.  Chest x-ray unremarkable.  Past Medical History:  Diagnosis Date   Cardiac  murmur      Past Surgical History:  Procedure Laterality Date   ABDOMINAL HYSTERECTOMY       History reviewed. No pertinent family history.  Social History: Social History   Socioeconomic History   Marital status: Single    Spouse name: Not on file   Number of children: Not on file   Years of education: Not on file   Highest education level: Not on file  Occupational History   Not on file  Tobacco Use   Smoking status: Never   Smokeless tobacco: Never  Vaping Use   Vaping status: Never Used  Substance and Sexual Activity   Alcohol use: Yes    Comment: rare   Drug use: No   Sexual activity: Not Currently  Other Topics Concern   Not on file  Social History Narrative   ** Merged History Encounter **       Social Drivers of Health   Financial Resource Strain: Low Risk  (10/24/2023)   Received from [facility]   Overall Financial Resource Strain (CARDIA)    Difficulty of Paying Living Expenses: Not hard at all  Food Insecurity: No Food Insecurity (10/24/2023)   Received from [facility]   Hunger Vital Sign    Worried About Running Out of Food in the Last Year: Never true    Ran Out of Food in the Last Year: Never true  Transportation Needs: No Transportation Needs (10/24/2023)   Received from [facility] - Transportation    Lack of Transportation (Medical):  No    Lack of Transportation (Non-Medical): No  Physical Activity: Sufficiently Active (10/24/2023)   Received from [facility]   Exercise Vital Sign    Days of Exercise per Week: 7 days    Minutes of Exercise per Session: 30 min  Stress: No Stress Concern Present (10/24/2023)   Received from [facility] - Occupational Stress Questionnaire    Feeling of Stress : Not at all  Social Connections: Socially Integrated (10/24/2023)   Received from [facility]   Social Network    How would you rate your social network (family, work, friends)?: Good  participation with social networks  Intimate Partner Violence: Not At Risk (10/24/2023)   Received from [facility]   HITS    Over the last 12 months how often did your partner physically hurt you?: Never    Over the last 12 months how often did your partner insult you or talk down to you?: Never    Over the last 12 months how often did your partner threaten you with physical harm?: Never    Over the last 12 months how often did your partner scream or curse at you?: Never     (Not in a hospital admission)   ROS: General: no fevers/chills/night sweats Eyes: no blurry vision, diplopia, or amaurosis ENT: no sore throat or hearing loss Resp: no cough, wheezing, or hemoptysis CV: no edema, palpitations + GI: no abdominal pain, nausea, vomiting, diarrhea, or constipation GU: no dysuria, frequency, or hematuria Skin: no rash Neuro: no headache, numbness, tingling, or weakness of extremities Musculoskeletal: no joint pain or swelling Heme: no bleeding, DVT, or easy bruising Endo: no polydipsia or polyuria    Physical Exam: Blood pressure (!) 149/81, pulse 82, temperature 98 F (36.7 C), resp. rate 19, height 5\' 8"  (1.727 m), weight 63.5 kg, SpO2 96%.  Pt is alert and oriented, WD, WN, in no distress. HEENT: normal Neck: JVP normal. Carotid upstrokes normal without bruits. No thyromegaly. Lungs: equal expansion, clear bilaterally CV: Apex is discrete and nondisplaced, RRR without murmur or gallop Abd: soft, NT, +BS, no bruit, no hepatosplenomegaly Ext: no C/C/E        Femoral pulses 2+= without bruits        DP/PT pulses intact and = Skin: warm and dry without rash Neuro: CNII-XII intact             Strength intact = bilaterally  Labs:   Lab Results  Component Value Date   WBC 6.1 12/10/2023   HGB 15.4 (H) 12/10/2023   HCT 46.5 (H) 12/10/2023   MCV 89.6 12/10/2023   PLT 257 12/10/2023    Recent Labs  Lab 12/10/23 0410  NA 141  K 3.4*  CL 111  CO2 20*  BUN 13   CREATININE 0.57  CALCIUM 9.2  GLUCOSE 122*   No results found for: "CKTOTAL", "CKMB", "CKMBINDEX", "TROPONINI"  Lab Results  Component Value Date   CHOL 192 12/10/2023   Lab Results  Component Value Date   HDL 55 12/10/2023   Lab Results  Component Value Date   LDLCALC 120 (H) 12/10/2023   Lab Results  Component Value Date   TRIG 86 12/10/2023   Lab Results  Component Value Date   CHOLHDL 3.5 12/10/2023   No results found for: "LDLDIRECT"    Radiology: DG Chest Portable 1 View Result Date: 12/10/2023 CLINICAL DATA:  Chest pain. EXAM: PORTABLE CHEST 1 VIEW COMPARISON:  None  Available. FINDINGS: The heart size and mediastinal contours are within normal limits. There is aortic atherosclerosis. Both lungs are clear. The visualized skeletal structures are intact, with osteopenia. Multiple overlying monitor wires. IMPRESSION: No evidence of acute chest disease. Aortic atherosclerosis. Electronically Signed   By: Almira Bar M.D.   On: 12/10/2023 05:05   EKG: Afib with RVR, rate 150s   ASSESSMENT AND PLAN:   Atrial Fibrillation with RVR, now sinus rhythm:   Patient presenting with new-onset afib. Etiology is unknown with cause possibly HTN-No history, thyroid dysfunction-TSH slightly elevated, ischemic heart disease, CHF-unlikely given BNP, drug abuse-denies, hypoxia-possible with erythrocytosis. She has now converted to sinus rhythm, anticipate transition from IV dilt to PO rate control as indicated as well as assessing risk and benefits of continued anticoagulation. Ordered repeat EKG. Lipid panel with LDL 120, on no medications with PREVENT score 14.16 % 10-Year Total CVD Risk.  -HS troponin flat; will not repeat -Will request Echocardiogram for further evaluation  -Will risk stratify with HgbA1c; will also order UDS.  -Repeat EKG  -CHA2DS2-VASc Score is 3 and so patient would benefit from oral anticoagulation. Started on Eliquis. -Consider high intensity statin    Elevated LDL  Aortic Atherosclerosis Lipid panel with LDL 120, on no medications with PREVENT score 14.16 % 10-Year Total CVD Risk. Consider high intensity statin   Patient has converted to NSR and is hemodynamically stable. She is stable for discharge from cardiology standpoint. Continue with Cardizem CD 120 every day with Cardizem short acting tabs to use for recurrence of Afib. She will have Afib clinic follow up outpatient.   Monica Lowe 12/10/2023, 12:21 PM  Personally seen and examined. Agree with above.  72 with new onset atrial fibrillation RVR who converted after diltiazem IV in the emergency department.  She did go to a holiday party yesterday and had 2 alcoholic beverages.  Usually does not drink alcohol.  No other incidences of atrial fibrillation.  She was symptomatic.  No bleeding disorders.  No fevers chills nausea vomiting syncope orthopnea chest pain.  TSH was slightly abnormal.  Free T4 pending.  On exam, alert and orient x 3 no acute distress, heart regular rate rhythm, no significant murmurs, lungs are clear  LDL 120  Currently converted.  Previously 150 bpm symptomatic with A-fib. -Start Eliquis 5 mg twice a day, CHADSVASc for 3 or female age greater than 65 and hypertension -We will start Cardizem CD 120 mg once a day.  Will also give her a short acting diltiazem 30 mg every 6 hours as needed.  We will go ahead and have her see A-fib clinic at the end of January.  She is going to be traveling to [facility] as well as a [place] cruise soon.  I think it is fine for her to go.  Donato Schultz, MD

## 2023-12-10 NOTE — H&P (Addendum)
History and Physical    Patient: Monica Lowe ZOX:[id number] DOB: 16-Jun-1951 DOA: 12/10/2023 DOS: the patient was seen and examined on 12/10/2023 PCP: Tracey Harries, MD  Patient coming from: Home via EMS  Chief Complaint: Palpitations  HPI: Monica Lowe is a 72 y.o. female with medical history significant of who presents with complaints of  palpitations described as a "crazy heartbeat jumping around." Symptoms began abruptly at 2 AM while sleeping, accompanied by jaw pain. She removed her retainer which she normally wears, thinking it was the cause of the jaw discomfort.  The patient got out of bed went downstairs, but still reported her heart racing and feeling like it was "moving all over" her chest. The jaw pain resolved after removing her retainer. After 45 minutes, she called a neighbor who asked her to call 911.  She denies associated shortness of breath, fever, chest pain, diaphoresis nausea, vomiting, diarrhea, weight gain, or leg swelling.  Yesterday she had went to a social event and had 2 cocktails, but normally does not drink alcohol on a daily basis denies any history of tobacco or drug use.  The patient has a history of a heart murmur since childhood, which worsened during pregnancy. In 2015 she had been evaluated by cardiology and had an echocardiogram which she reports 3 of her valves don't close completely.  In the emergency department patient was noted to be in atrial fibrillation with heart rates elevated to 150s blood pressures 127/78 to 158/92, and all other vital signs were maintained.  Labs significant for hemoglobin 15.4, potassium 3.2, and high-sensitivity troponin 17.  Chest x-ray noted aortic atherosclerosis without acute abnormality.  Patient had been started on a Cardizem drip.  Review of Systems: As mentioned in the history of present illness. All other systems reviewed and are negative. History reviewed. No pertinent past medical history. Past Surgical  History:  Procedure Laterality Date   ABDOMINAL HYSTERECTOMY     Social History:  reports that she has never smoked. She has never used smokeless tobacco. She reports current alcohol use. She reports that she does not use drugs.  Allergies  Allergen Reactions   Sulfa Antibiotics Rash    History reviewed. No pertinent family history.  Patient reports being estranged from family.  Prior to Admission medications   Not on File    Physical Exam: Vitals:   12/10/23 0353 12/10/23 0356 12/10/23 0445 12/10/23 0615  BP: (!) 148/123 (!) 158/92 (!) 141/69 127/78  Pulse: (!) 141 (!) 110 96 88  Resp: 18 17 16 15   Temp: 98.2 F (36.8 C)     TempSrc: Oral     SpO2: 96% 95% 95% 97%  Weight:      Height:       Constitutional: Elderly female currently no acute distress Eyes: PERRL, lids and conjunctivae normal ENMT: Mucous membranes are moist.  Normal dentition.  Neck: normal, suppl  Respiratory: Normal respiratory effort with some mild crackles noted in the lower lung fields.  Normal respiratory effort. No accessory muscle use.  Cardiovascular: Irregular irregular and tachycardic. No extremity edema. 2+ pedal pulses. No carotid bruits.  Abdomen: no tenderness, no masses palpated. No hepatosplenomegaly. Bowel sounds positive.  Musculoskeletal: no clubbing / cyanosis. No joint deformity upper and lower extremities Normal muscle tone.  Skin: no rashes, lesions, ulcers. No induration Neurologic: CN 2-12 grossly intact. Strength 5/5 in all 4.  Psychiatric: Normal judgment and insight. Alert and oriented x 3. Normal mood.   Data Reviewed:  EKG revealed atrial  fibrillation with RVR at 155 bpm.  Reviewed labs, imaging, and pertinent records as documented  Assessment and Plan:  New onset atrial fibrillation Patient presents with complaints of palpitations waking her up at around 2 AM this morning.  Found to be in atrial fibrillation with heart rates into the 150s.  Patient had been started on a  Cardizem drip after bolus. CHA2DS2-VASc score is equal to at least 2. -Admit to a telemetry bed -Goal potassium at least 4 and magnesium at least 2.  Replacing electrolytes as needed -Add-on TSH and BMP -Check echocardiogram -Continue Cardizem drip per pharmacy -Discussed risk of stroke started patient on Eliquis -Recheck EKG as needed for changes in heart rhythms -Cardiology consulted at patient request  Elevated troponin Aortic atherosclerosis Acute.  High-sensitivity troponins 17->20.  Patient denies having any chest pain.  Possibly secondary to demand in setting of rapid atrial fibrillation.  Chest x-ray had noted aortic atherosclerosis. -Add on lipid panel -Continue to trend troponin x 1 -Follow-up echocardiogram  Hypokalemia Acute.  Potassium noted to be mildly low at 3.4. -Give potassium chloride 60 mEq p.o. -Continue to monitor and replace as needed  Diastolic dysfunction On physical exam patient appears to be euvolemic last echocardiogram noted EF to be 60 to 65% with grade 1 diastolic dysfunction back in 2015. -Strict I&O's and daily weights -Follow-up repeat echocardiogram  Erythrocytosis Hemoglobin 15.4.  Unclear cause patient denies any history of tobacco abuse.  No prior to compare possibly secondary to hemoconcentration. -Recheck CBC in a.m.   DVT prophylaxis: Eliquis Advance Care Planning:   Code Status: Full Code    Consults: Cardiology  Family Communication: None  Severity of Illness: The appropriate patient status for this patient is OBSERVATION. Observation status is judged to be reasonable and necessary in order to provide the required intensity of service to ensure the patient's safety. The patient's presenting symptoms, physical exam findings, and initial radiographic and laboratory data in the context of their medical condition is felt to place them at decreased risk for further clinical deterioration. Furthermore, it is anticipated that the patient  will be medically stable for discharge from the hospital within 2 midnights of admission.   Author: Clydie Braun, MD 12/10/2023 6:33 AM  For on call review [email or web address].

## 2023-12-10 NOTE — Discharge Instructions (Signed)
  Please continue with the eliquis twice a day and take the Cardizem CD once daily. We have provided short acting cardizem to take if you experience heart palpitations or recurrence of Afib.   Please come into the Afib clinic. Phone number to call if needed [phone]

## 2023-12-10 NOTE — Discharge Summary (Signed)
Physician Discharge Summary   Patient: Monica Lowe MRN: [id number] DOB: 01-30-1951  Admit date:     12/10/2023  Discharge date: 12/10/23  Discharge Physician: Clydie Braun   PCP: Tracey Harries, MD   Recommendations at discharge:   Patient should follow-up with primary care provider and have repeat thyroid testing with TSH and free T4  Discharge Diagnoses: Principal Problem:   New onset a-fib (HCC) Active Problems:   Elevated troponin   Hypokalemia   Diastolic dysfunction   Erythrocytosis  Resolved Problems: Atrial fibrillation  Hospital Course:  Monica Lowe is a 72 y.o. female with medical history significant of history of cardiac murmur who presents with complaints of  palpitations described as a "crazy heartbeat jumping around." Symptoms began abruptly at 2 AM while sleeping, accompanied by jaw pain. She removed her retainer which she normally wears, thinking it was the cause of the jaw discomfort.  The patient got out of bed went downstairs, but still reported her heart racing and feeling like it was "moving all over" her chest. The jaw pain resolved after removing her retainer. After 45 minutes, she called a neighbor who asked her to call 911.  She denies associated shortness of breath, fever, chest pain, diaphoresis nausea, vomiting, diarrhea, weight gain, or leg swelling.  Yesterday she had went to a social event and had 2 cocktails, but normally does not drink alcohol on a daily basis denies any history of tobacco or drug use.   The patient has a history of a heart murmur since childhood, which worsened during pregnancy. In 2015 she had been evaluated by cardiology and had an echocardiogram which she reports 3 of her valves don't close completely.  This is Dr. Katrinka Blazing on his current hospital is: Reviewed update one of your labs your thyroid study was noted to be abnormal with elevated In the emergency department patient was noted to be in atrial fibrillation with  heart rates elevated to 150s blood pressures 127/78 to 158/92, and all other vital signs were maintained.  Labs significant for hemoglobin 15.4, potassium 3.2, and high-sensitivity troponin 17.  Chest x-ray noted aortic atherosclerosis without acute abnormality.  Patient had been started on a Cardizem drip.  Subsequently patient converted to a sinus rhythm    Assessment and Plan:  New onset atrial fibrillation Patient presents with complaints of palpitations waking her up at around 2 AM this morning.  Found to be in atrial fibrillation with heart rates into the 150s.  Patient had been started on a Cardizem drip after bolus. CHA2DS2-VASc score is equal to at least 2.  Orders were placed to start Eliquis.  She did convert into a sinus rhythm while on Cardizem drip.  TSH was noted to be abnormal which could be possible cause of patient going into atrial fibrillation.  Cardiology had formally evaluated and recommended patient follow-up in the atrial fibrillation clinic and have outpatient echocardiogram.  She was started on Cardizem CD 120 mg  daily and advised to take diltiazem 30 mg p.o. as needed for palpitations.  Elevated troponin Aortic atherosclerosis High-sensitivity troponins 17->20->21.  Patient denies having any chest pain.  Chest x-ray had noted aortic atherosclerosis. Possibly secondary to demand in setting of rapid atrial fibrillation.  Echocardiogram had initially been ordered, but cardiology recommending patient have outpatient echocardiogram.  Hypokalemia Initial potassium noted to be 3.4.  Repleted.  Diastolic dysfunction Last echocardiogram noted EF to be 60 to 65% with grade 1 diastolic dysfunction back in 2015.  Will need to  follow-up and have outpatient echocardiogram  Erythrocytosis Hemoglobin noted to be elevated at 15.4.  No prior labs to compare.  Abnormal TSH TSH was elevated at 4.778.  Orders have been placed to check free T4, but had not been obtained prior to patient  being discharged.   Pain control - [employer] Controlled Substance Reporting System database was reviewed. and patient was instructed, not to drive, operate heavy machinery, perform activities at heights, swimming or participation in water activities or provide baby-sitting services while on Pain, Sleep and Anxiety Medications; until their outpatient Physician has advised to do so again. Also recommended to not to take more than prescribed Pain, Sleep and Anxiety Medications.  Consultants: Cardiology Procedures performed: None Disposition: Home Diet recommendation:  Discharge Diet Orders (From admission, onward)     Start     Ordered   12/10/23 0000  Diet - low sodium heart healthy        12/10/23 1549            DISCHARGE MEDICATION: Allergies as of 12/10/2023       Reactions   Sulfa Antibiotics Rash        Medication List     TAKE these medications    apixaban 5 MG Tabs tablet Commonly known as: ELIQUIS Take 1 tablet (5 mg total) by mouth 2 (two) times daily.   diltiazem 120 MG 24 hr capsule Commonly known as: Cardizem CD Take 1 capsule (120 mg total) by mouth daily.   diltiazem 30 MG tablet Commonly known as: Cardizem Take 1 tablet (30 mg total) by mouth 4 (four) times daily as needed (Take every 6 hours as needed for heart palpitations or recurrence of afib).        Follow-up Information     [facility] Office Follow up.   Specialty: Cardiology Contact information: [address], [address] [place] [address] [phone]               Discharge Exam: Ceasar Mons Weights   12/10/23 0352  Weight: 63.5 kg   Constitutional: Elderly female currently no acute distress Eyes: PERRL, lids and conjunctivae normal ENMT: Mucous membranes are moist.  Normal dentition.  Neck: normal, supple Respiratory:  Normal respiratory effort. No accessory muscle use.  Cardiovascular:  sinus rhythm.  No extremity edema. 2+ pedal  pulses. No carotid bruits.  Abdomen: no tenderness, no masses palpated. No hepatosplenomegaly. Bowel sounds positive.  Musculoskeletal: no clubbing / cyanosis. No joint deformity upper and lower extremities Normal muscle tone.  Skin: no rashes, lesions, ulcers. No induration Neurologic: CN 2-12 grossly intact. Strength 5/5 in all 4.  Psychiatric: Normal judgment and insight. Alert and oriented x 3. Normal mood.   Condition at discharge: good  The results of significant diagnostics from this hospitalization (including imaging, microbiology, ancillary and laboratory) are listed below for reference.   Imaging Studies: DG Chest Portable 1 View Result Date: 12/10/2023 CLINICAL DATA:  Chest pain. EXAM: PORTABLE CHEST 1 VIEW COMPARISON:  None Available. FINDINGS: The heart size and mediastinal contours are within normal limits. There is aortic atherosclerosis. Both lungs are clear. The visualized skeletal structures are intact, with osteopenia. Multiple overlying monitor wires. IMPRESSION: No evidence of acute chest disease. Aortic atherosclerosis. Electronically Signed   By: Almira Bar M.D.   On: 12/10/2023 05:05    Microbiology: No results found for this or any previous visit.  Labs: CBC: Recent Labs  Lab 12/10/23 0410  WBC 6.1  HGB 15.4*  HCT 46.5*  MCV 89.6  PLT 257   Basic Metabolic Panel: Recent Labs  Lab 12/10/23 0410  NA 141  K 3.4*  CL 111  CO2 20*  GLUCOSE 122*  BUN 13  CREATININE 0.57  CALCIUM 9.2  MG 2.0   Liver Function Tests: No results for input(s): "AST", "ALT", "ALKPHOS", "BILITOT", "PROT", "ALBUMIN" in the last 168 hours. CBG: No results for input(s): "GLUCAP" in the last 168 hours.  Discharge time spent: greater than 30 minutes.  Signed: Clydie Braun, MD [facility] 12/10/2023

## 2024-01-22 ENCOUNTER — Ambulatory Visit (HOSPITAL_COMMUNITY)
Admission: RE | Admit: 2024-01-22 | Discharge: 2024-01-22 | Disposition: A | Discharge status: Home / Self Care | Payer: Medicare Other | Source: Ambulatory Visit | Attending: Internal Medicine | Admitting: Internal Medicine

## 2024-01-22 VITALS — BP 140/84 | HR 87 | Ht 68.0 in | Wt 145.6 lb

## 2024-01-22 DIAGNOSIS — I110 Hypertensive heart disease with heart failure: Secondary | ICD-10-CM | POA: Insufficient documentation

## 2024-01-22 DIAGNOSIS — D6869 Other thrombophilia: Secondary | ICD-10-CM | POA: Diagnosis not present

## 2024-01-22 DIAGNOSIS — Z79899 Other long term (current) drug therapy: Secondary | ICD-10-CM | POA: Diagnosis not present

## 2024-01-22 DIAGNOSIS — Z7901 Long term (current) use of anticoagulants: Secondary | ICD-10-CM | POA: Insufficient documentation

## 2024-01-22 DIAGNOSIS — I480 Paroxysmal atrial fibrillation: Secondary | ICD-10-CM | POA: Diagnosis not present

## 2024-01-22 MED ORDER — DILTIAZEM HCL ER COATED BEADS 120 MG PO CP24: 120.0000 mg | ORAL_CAPSULE | Freq: Every day | ORAL | 3 refills | Status: DC

## 2024-01-22 MED ORDER — APIXABAN 5 MG PO TABS: 5.0000 mg | ORAL_TABLET | Freq: Two times a day (BID) | ORAL | 3 refills | Status: DC

## 2024-01-22 NOTE — Progress Notes (Signed)
Primary Care Physician: Tracey Harries, MD Primary Cardiologist: None Electrophysiologist: None     Referring Physician: Dr. Leonides Grills Monica Lowe is a 73 y.o. female with a history of HTN who presents for consultation in the [facility]. Admitted and discharged on 12/10/23 for new onset Afib with RVR discharged in NSR; given Cardizem 120 mg daily with diltiazem 30 mg PRN. Patient is on Eliquis 5 mg BID for a CHADS2VASC score of 3.  On evaluation today, she is currently in NSR. She admits to stopping the medications 3 days after beginning them; they made her feel tired. She went on her [place] and had a second episode of Afib which lasted for an hour. She saw the cruise ship MD and he recommended she continue her medications without stopping. She resumed the diltiazem and Eliquis on 12/30 and feels normal without fatigue. She notes on both occasions of Afib they were preceded by 2 alcoholic drinks; she has stopped alcohol consumption since then. She wears an Apple watch for monitoring; no new episodes since cruise. No missed doses of Eliquis since 12/30. She does not drink excessive caffeine. She does not snore and sleeps well.   Today, she denies symptoms of chest pain, shortness of breath, orthopnea, PND, lower extremity edema, dizziness, presyncope, syncope, snoring, daytime somnolence, bleeding, or neurologic sequela. The patient is tolerating medications without difficulties and is otherwise without complaint today.    Atrial Fibrillation Risk Factors:  she does not have symptoms or diagnosis of sleep apnea.  she has a BMI of Body mass index is 22.14 kg/m.Marland Kitchen Filed Weights   01/22/24 1035  Weight: 66 kg    Current Outpatient Medications  Medication Sig Dispense Refill   ciprofloxacin (CIPRO) 250 MG tablet Take 250 mg by mouth 2 (two) times daily. As needed for traveling     diltiazem (CARDIZEM) 30 MG tablet Take 1 tablet (30  mg total) by mouth 4 (four) times daily as needed (Take every 6 hours as needed for heart palpitations or recurrence of afib). 30 tablet 0   LORazepam (ATIVAN) 0.5 MG tablet Take 0.5 mg by mouth as needed. Only for traveling     apixaban (ELIQUIS) 5 MG TABS tablet Take 1 tablet (5 mg total) by mouth 2 (two) times daily. 60 tablet 3   diltiazem (CARDIZEM CD) 120 MG 24 hr capsule Take 1 capsule (120 mg total) by mouth daily. 30 capsule 3   No current facility-administered medications for this encounter.    Atrial Fibrillation Management history:  Previous antiarrhythmic drugs: none Previous cardioversions: none Previous ablations: none Anticoagulation history: Eliquis   ROS- All systems are reviewed and negative except as per the HPI above.  Physical Exam: BP (!) 140/84   Pulse 87   Ht 5\' 8"  (1.727 m)   Wt 66 kg   BMI 22.14 kg/m   GEN: Well nourished, well developed in no acute distress NECK: No JVD; No carotid bruits CARDIAC: Regular rate and rhythm, no murmurs, rubs, gallops RESPIRATORY:  Clear to auscultation without rales, wheezing or rhonchi  ABDOMEN: Soft, non-tender, non-distended EXTREMITIES:  No edema; No deformity   EKG today demonstrates  Vent. rate 87 BPM PR interval 158 ms QRS duration 82 ms QT/QTcB 350/421 ms P-R-T axes 76 70 80 Normal sinus rhythm Normal ECG When compared with ECG of 10-Dec-2023 12:46, PREVIOUS ECG IS PRESENT  Echo ordered to update. Study in 2015 showed normal LV function.  ASSESSMENT & PLAN CHA2DS2-VASc Score = 3  The patient's score is based upon: CHF History: 0 HTN History: 1 Diabetes History: 0 Stroke History: 0 Vascular Disease History: 0 Age Score: 1 Gender Score: 1       ASSESSMENT AND PLAN: Paroxysmal Atrial Fibrillation (ICD10:  I48.0) The patient's CHA2DS2-VASc score is 3, indicating a 3.2% annual risk of stroke.    She is currently in NSR. Education provided about Afib. Discussion about medication treatments and  ablation going forward if indicated. After discussion, we will proceed with conservative observation at this time. Will continue taking diltiazem 120 mg daily. She would like to establish with general cardiology so will order referral.    Secondary Hypercoagulable State (ICD10:  D68.69) The patient is at significant risk for stroke/thromboembolism based upon her CHA2DS2-VASc Score of 3.  Continue Apixaban (Eliquis).  Continue without interruption. Draw CBC at next visit.    Refer to general cardiology per patient request. Follow up 3 months to reassess burden, sooner if notes Afib episodes.    Monica Bells, PA-C  Afib Clinic [facility] [address] [place], [place] [address] [phone]

## 2024-01-25 ENCOUNTER — Telehealth (HOSPITAL_COMMUNITY): Payer: Self-pay

## 2024-01-25 ENCOUNTER — Other Ambulatory Visit (HOSPITAL_COMMUNITY): Payer: Self-pay

## 2024-01-25 DIAGNOSIS — I4891 Unspecified atrial fibrillation: Secondary | ICD-10-CM

## 2024-01-25 NOTE — Addendum Note (Signed)
Addended by: Hyman Bower F on: 01/25/2024 10:07 AM   Modules accepted: Orders

## 2024-01-25 NOTE — Telephone Encounter (Signed)
Patient called in and states her toes are itching and they are swelling. Symptoms started over the weekend. She is also having constipation and shortness of breath. Per Landry Mellow- instruct patient to stop her Diltiazem for 3 days to see if her symptoms subside. She was told to contact our clinic back in a few days to let us know how she is doing. Advised patient to reach out to her pcp regarding her symptoms as well. Informed patient I will send her a lab requisition to have her blood work drawn at her pcp. Consulted with patient and she verbalized understanding.

## 2024-02-02 ENCOUNTER — Ambulatory Visit (HOSPITAL_COMMUNITY)
Admission: RE | Admit: 2024-02-02 | Discharge: 2024-02-02 | Disposition: A | Discharge status: Home / Self Care | Payer: Medicare Other | Source: Ambulatory Visit | Attending: Internal Medicine | Admitting: Internal Medicine

## 2024-02-02 DIAGNOSIS — I480 Paroxysmal atrial fibrillation: Secondary | ICD-10-CM | POA: Insufficient documentation

## 2024-02-02 DIAGNOSIS — I517 Cardiomegaly: Secondary | ICD-10-CM | POA: Diagnosis not present

## 2024-02-02 DIAGNOSIS — I493 Ventricular premature depolarization: Secondary | ICD-10-CM | POA: Insufficient documentation

## 2024-02-02 DIAGNOSIS — I340 Nonrheumatic mitral (valve) insufficiency: Secondary | ICD-10-CM | POA: Insufficient documentation

## 2024-02-02 LAB — ECHOCARDIOGRAM COMPLETE
AR max vel: 2.09 cm2
AV Area VTI: 2.13 cm2
AV Area mean vel: 1.93 cm2
AV Mean grad: 5 mm[Hg]
AV Peak grad: 9.6 mm[Hg]
Ao pk vel: 1.55 m/s
Area-P 1/2: 3.34 cm2
S' Lateral: 2.3 cm

## 2024-02-03 ENCOUNTER — Encounter (HOSPITAL_COMMUNITY): Payer: Self-pay

## 2024-04-28 ENCOUNTER — Ambulatory Visit (HOSPITAL_COMMUNITY): Payer: Medicare Other | Admitting: Internal Medicine

## 2024-06-20 ENCOUNTER — Ambulatory Visit: Admitting: Cardiovascular Disease

## 2024-08-31 ENCOUNTER — Encounter (HOSPITAL_BASED_OUTPATIENT_CLINIC_OR_DEPARTMENT_OTHER): Payer: Self-pay | Admitting: Cardiology

## 2024-08-31 ENCOUNTER — Ambulatory Visit (HOSPITAL_BASED_OUTPATIENT_CLINIC_OR_DEPARTMENT_OTHER): Admitting: Cardiology

## 2024-08-31 VITALS — BP 132/82 | HR 81 | Resp 17 | Ht 68.0 in | Wt 151.0 lb

## 2024-08-31 DIAGNOSIS — Z712 Person consulting for explanation of examination or test findings: Secondary | ICD-10-CM | POA: Diagnosis not present

## 2024-08-31 DIAGNOSIS — Z7189 Other specified counseling: Secondary | ICD-10-CM

## 2024-08-31 DIAGNOSIS — I480 Paroxysmal atrial fibrillation: Secondary | ICD-10-CM

## 2024-08-31 NOTE — Patient Instructions (Signed)
 Medication Instructions:   Your physician recommends that you continue on your current medications as directed. Please refer to the Current Medication list given to you today.  *If you need a refill on your cardiac medications before your next appointment, please call your pharmacy*    Follow-Up:  AS NEEDED WITH DR. LONNI

## 2024-08-31 NOTE — Progress Notes (Signed)
  Cardiology Office Note:  .   Date:  08/31/2024  ID:  Monica Lowe, DOB Jun 13, 1951, MRN [id number] PCP: Monica Lenis, MD  [facility] Providers Cardiologist:  Monica Bruckner, MD {  History of Present Illness: .   Monica Lowe is a 73 y.o. female with PMH paroxysmal atrial fibrillation. He is seen as a new patient for evaluation and management of atrial fibrillation. She was seen in the hospital in 11/2023 by Dr. Jeffrie  Pertinent CV history: admitted 11/2023 with new onset afib RVR, spontaneously converted. Started on cardizem  and apixaban  during that hospitalization, but stopped several days after discharge due to fatigue. However, had an episode of afib while on a cruise and then restarted her medications. Both times her afib occurred after drinking alcohol.  Today: Has not had any more afib since December. Has not had any alcohol as this was the trigger to both of her events. Has a smartwatch, has not had any alerts. Has stopped taking apixaban  and diltiazem  after about two months.   Walks everywhere, works out daily, lives on the 4th floor and takes the stairs. Vegetarian, eats well.   ROS: Denies chest pain, shortness of breath at rest or with normal exertion. No PND, orthopnea, LE edema or unexpected weight gain. No syncope or palpitations. ROS otherwise negative except as noted.   Studies Reviewed: SABRA    EKG:       Physical Exam:   VS:  BP 132/82 (BP Location: Left Arm, Patient Position: Sitting, Cuff Size: Normal)   Pulse 81   Resp 17   Ht 5' 8 (1.727 m)   Wt 151 lb (68.5 kg)   SpO2 97%   BMI 22.96 kg/m    Wt Readings from Last 3 Encounters:  08/31/24 151 lb (68.5 kg)  01/22/24 145 lb 9.6 oz (66 kg)  12/10/23 139 lb 15.9 oz (63.5 kg)    GEN: Well nourished, well developed in no acute distress HEENT: Normal, moist mucous membranes NECK: No JVD CARDIAC: regular rhythm, normal S1 and S2, no rubs or gallops. No murmur. VASCULAR: Radial and DP  pulses 2+ bilaterally. No carotid bruits RESPIRATORY:  Clear to auscultation without rales, wheezing or rhonchi  ABDOMEN: Soft, non-tender, non-distended MUSCULOSKELETAL:  Ambulates independently SKIN: Warm and dry, no edema NEUROLOGIC:  Alert and oriented x 3. No focal neuro deficits noted. PSYCHIATRIC:  Normal affect    ASSESSMENT AND PLAN: .    Paroxysmal atrial fibrillation -diagnosed 11/2023 when presenting to hospital -CHA2DS2/VAS Stroke Risk Points=2 (age, gender) -she is instantly symptomatic -echo unremarkable, test reviewed -she has stopped taking apixaban  and diltiazem  per preference -we discussed afib at length, triggers, risk of stroke -she will contact us  if she has recurrent symptoms  CV risk counseling and prevention -recommend heart healthy/Mediterranean diet, with whole grains, fruits, vegetable, fish, lean meats, nuts, and olive oil. Limit salt. -recommend moderate walking, 3-5 times/week for 30-50 minutes each session. Aim for at least 150 minutes/week. Goal should be pace of 3 miles/hours, or walking 1.5 miles in 30 minutes -recommend avoidance of tobacco products. Avoid excess alcohol.  Dispo: I would be happy to see her back as needed  Signed, Monica Bruckner, MD   Monica Bruckner, MD, PhD, [facility]  [facility]  [place]  Heart & Vascular at [facility] at [facility] [address], [address] [place], [place] [address] [phone]

## 2024-09-06 ENCOUNTER — Encounter: Payer: Self-pay | Admitting: Plastic Surgery

## 2024-09-06 ENCOUNTER — Ambulatory Visit (INDEPENDENT_AMBULATORY_CARE_PROVIDER_SITE_OTHER): Payer: Self-pay | Admitting: Plastic Surgery

## 2024-09-06 VITALS — BP 159/85 | HR 70 | Ht 68.0 in | Wt 148.0 lb

## 2024-09-06 DIAGNOSIS — Z719 Counseling, unspecified: Secondary | ICD-10-CM | POA: Insufficient documentation

## 2024-09-06 NOTE — Progress Notes (Signed)
     Patient ID: Monica Lowe, female    DOB: [birth year], 73 y.o.   MRN: [id number]   Chief Complaint  Patient presents with   Advice Only   Skin Problem    The patient is a 73 year old female here for evaluation of her face.  She has several areas of hyperpigmentation.  This looks to be mostly sun damage.  Her past medical history is positive for atrial fibrillation and an abdominal hysterectomy she is otherwise in good health.  She is interested and seeing if she can get the dark spots to lighten up or even go away on her face.  She does not have a history of skin cancer or a family history of skin cancer.  The areas look to be mostly around the cheek area and forehead.  None of them are concerning.    Review of Systems  Constitutional: Negative.   HENT: Negative.    Eyes: Negative.   Respiratory: Negative.    Cardiovascular: Negative.   Gastrointestinal: Negative.   Endocrine: Negative.   Genitourinary: Negative.   Musculoskeletal: Negative.     Past Medical History:  Diagnosis Date   Atrial fibrillation (HCC)    Cardiac murmur     Past Surgical History:  Procedure Laterality Date   ABDOMINAL HYSTERECTOMY        Current Outpatient Medications:    LORazepam (ATIVAN) 0.5 MG tablet, Take 0.5 mg by mouth as needed. Only for traveling, Disp: , Rfl:    ciprofloxacin (CIPRO) 250 MG tablet, Take 250 mg by mouth 2 (two) times daily. As needed for traveling (Patient not taking: Reported on 09/06/2024), Disp: , Rfl:    Objective:   Vitals:   09/06/24 0802  BP: (!) 159/85  Pulse: 70  SpO2: 99%    Physical Exam Vitals reviewed.  Constitutional:      Appearance: Normal appearance.  HENT:     Head: Atraumatic.  Cardiovascular:     Rate and Rhythm: Normal rate.     Pulses: Normal pulses.  Skin:    General: Skin is warm.  Neurological:     Mental Status: She is alert and oriented to person, place, and time.  Psychiatric:        Mood and Affect: Mood normal.         Behavior: Behavior normal.        Thought Content: Thought content normal.        Judgment: Judgment normal.     Assessment & Plan:  Encounter for counseling  I would like the patient to at least have a yearly skin exam by dermatologist.  Will also refer her to [facility] for skin care treatment.  I think she is a great candidate for the BBL.  I am in a send her information in her chart.  I showed her some pictures today.  I think 3 treatments of the BBL to start with spread out over 3 months will do really well for her.  Estefana RAMAN Arlee Santosuosso, DO

## 2024-09-20 ENCOUNTER — Ambulatory Visit (HOSPITAL_BASED_OUTPATIENT_CLINIC_OR_DEPARTMENT_OTHER): Admitting: Cardiology

## 2024-09-26 ENCOUNTER — Ambulatory Visit (INDEPENDENT_AMBULATORY_CARE_PROVIDER_SITE_OTHER): Payer: Self-pay

## 2024-09-26 DIAGNOSIS — Z719 Counseling, unspecified: Secondary | ICD-10-CM

## 2024-10-10 ENCOUNTER — Telehealth: Payer: Self-pay | Admitting: Plastic Surgery

## 2024-10-10 NOTE — Telephone Encounter (Signed)
 Patient said that Dr. JONETTA was going to send a referral to Dermatology, please advise

## 2024-10-10 NOTE — Telephone Encounter (Signed)
 Returned patients call. Advised I had sent in a referral at [facility] to establish care and should be receiving a call soon from them to schedule an appointment.  Patient appreciated returned call.

## 2024-10-10 NOTE — Addendum Note (Signed)
 Addended by: RONNI RANDINE KIDD on: 10/10/2024 04:57 PM   Modules accepted: Orders
